# Patient Record
Sex: Male | Born: 1975 | Race: White | Hispanic: No | Marital: Married | Smoking: Former smoker
Health system: Southern US, Community
[De-identification: ages and names within clinical notes are randomized; demographics above are authoritative.]

## PROBLEM LIST (undated history)

## (undated) DIAGNOSIS — S92251A Displaced fracture of navicular [scaphoid] of right foot, initial encounter for closed fracture: Secondary | ICD-10-CM

## (undated) DIAGNOSIS — S92001A Unspecified fracture of right calcaneus, initial encounter for closed fracture: Secondary | ICD-10-CM

## (undated) DIAGNOSIS — J159 Unspecified bacterial pneumonia: Secondary | ICD-10-CM

## (undated) DIAGNOSIS — S93314A Dislocation of tarsal joint of right foot, initial encounter: Secondary | ICD-10-CM

## (undated) DIAGNOSIS — B86 Scabies: Secondary | ICD-10-CM

## (undated) DIAGNOSIS — S92102B Unspecified fracture of left talus, initial encounter for open fracture: Secondary | ICD-10-CM

## (undated) DIAGNOSIS — S82143A Displaced bicondylar fracture of unspecified tibia, initial encounter for closed fracture: Secondary | ICD-10-CM

## (undated) DIAGNOSIS — R569 Unspecified convulsions: Secondary | ICD-10-CM

---

## 2015-05-07 ENCOUNTER — Inpatient Hospital Stay (HOSPITAL_COMMUNITY)
Admission: EM | Admit: 2015-05-07 | Discharge: 2015-05-14 | DRG: 562 | Disposition: A | Discharge status: Home / Self Care | Payer: No Typology Code available for payment source | Attending: Orthopedic Surgery | Admitting: Orthopedic Surgery

## 2015-05-07 DIAGNOSIS — S92001A Unspecified fracture of right calcaneus, initial encounter for closed fracture: Secondary | ICD-10-CM | POA: Diagnosis present

## 2015-05-07 DIAGNOSIS — S92253A Displaced fracture of navicular [scaphoid] of unspecified foot, initial encounter for closed fracture: Secondary | ICD-10-CM | POA: Diagnosis present

## 2015-05-07 DIAGNOSIS — R569 Unspecified convulsions: Secondary | ICD-10-CM

## 2015-05-07 DIAGNOSIS — Z79899 Other long term (current) drug therapy: Secondary | ICD-10-CM

## 2015-05-07 DIAGNOSIS — T1490XA Injury, unspecified, initial encounter: Secondary | ICD-10-CM

## 2015-05-07 DIAGNOSIS — S92251A Displaced fracture of navicular [scaphoid] of right foot, initial encounter for closed fracture: Secondary | ICD-10-CM | POA: Diagnosis present

## 2015-05-07 DIAGNOSIS — G40909 Epilepsy, unspecified, not intractable, without status epilepticus: Secondary | ICD-10-CM | POA: Diagnosis present

## 2015-05-07 DIAGNOSIS — Z419 Encounter for procedure for purposes other than remedying health state, unspecified: Secondary | ICD-10-CM

## 2015-05-07 DIAGNOSIS — B86 Scabies: Secondary | ICD-10-CM | POA: Diagnosis present

## 2015-05-07 DIAGNOSIS — S92102B Unspecified fracture of left talus, initial encounter for open fracture: Principal | ICD-10-CM | POA: Diagnosis present

## 2015-05-07 DIAGNOSIS — Y95 Nosocomial condition: Secondary | ICD-10-CM | POA: Diagnosis not present

## 2015-05-07 DIAGNOSIS — T1490 Injury, unspecified: Secondary | ICD-10-CM | POA: Diagnosis not present

## 2015-05-07 DIAGNOSIS — S82899A Other fracture of unspecified lower leg, initial encounter for closed fracture: Secondary | ICD-10-CM

## 2015-05-07 DIAGNOSIS — S82142A Displaced bicondylar fracture of left tibia, initial encounter for closed fracture: Secondary | ICD-10-CM | POA: Diagnosis present

## 2015-05-07 DIAGNOSIS — R509 Fever, unspecified: Secondary | ICD-10-CM

## 2015-05-07 DIAGNOSIS — E559 Vitamin D deficiency, unspecified: Secondary | ICD-10-CM | POA: Diagnosis present

## 2015-05-07 DIAGNOSIS — S92109A Unspecified fracture of unspecified talus, initial encounter for closed fracture: Secondary | ICD-10-CM

## 2015-05-07 DIAGNOSIS — S93316A Dislocation of tarsal joint of unspecified foot, initial encounter: Secondary | ICD-10-CM

## 2015-05-07 DIAGNOSIS — S82143A Displaced bicondylar fracture of unspecified tibia, initial encounter for closed fracture: Secondary | ICD-10-CM

## 2015-05-07 DIAGNOSIS — J159 Unspecified bacterial pneumonia: Secondary | ICD-10-CM | POA: Diagnosis not present

## 2015-05-07 DIAGNOSIS — R40241 Glasgow coma scale score 13-15, unspecified time: Secondary | ICD-10-CM | POA: Diagnosis present

## 2015-05-07 DIAGNOSIS — F1210 Cannabis abuse, uncomplicated: Secondary | ICD-10-CM | POA: Diagnosis present

## 2015-05-07 DIAGNOSIS — S9304XA Dislocation of right ankle joint, initial encounter: Secondary | ICD-10-CM | POA: Diagnosis present

## 2015-05-07 DIAGNOSIS — Y92411 Interstate highway as the place of occurrence of the external cause: Secondary | ICD-10-CM

## 2015-05-07 DIAGNOSIS — F17210 Nicotine dependence, cigarettes, uncomplicated: Secondary | ICD-10-CM | POA: Diagnosis present

## 2015-05-07 DIAGNOSIS — S93304A Unspecified dislocation of right foot, initial encounter: Secondary | ICD-10-CM

## 2015-05-07 DIAGNOSIS — S93314A Dislocation of tarsal joint of right foot, initial encounter: Secondary | ICD-10-CM | POA: Diagnosis present

## 2015-05-07 HISTORY — DX: Dislocation of tarsal joint of right foot, initial encounter: S93.314A

## 2015-05-07 HISTORY — DX: Scabies: B86

## 2015-05-07 HISTORY — DX: Displaced fracture of navicular (scaphoid) of right foot, initial encounter for closed fracture: S92.251A

## 2015-05-07 HISTORY — DX: Unspecified bacterial pneumonia: J15.9

## 2015-05-07 HISTORY — DX: Unspecified fracture of left talus, initial encounter for open fracture: S92.102B

## 2015-05-07 HISTORY — DX: Unspecified convulsions: R56.9

## 2015-05-07 HISTORY — DX: Displaced bicondylar fracture of unspecified tibia, initial encounter for closed fracture: S82.143A

## 2015-05-07 HISTORY — PX: CLOSED REDUCTION ANKLE FRACTURE: SUR210

## 2015-05-07 HISTORY — DX: Unspecified fracture of right calcaneus, initial encounter for closed fracture: S92.001A

## 2015-05-07 MED ORDER — MORPHINE SULFATE (PF) 4 MG/ML IV SOLN
4.0000 mg | Freq: Once | INTRAVENOUS | Status: AC
  Administered 2015-05-08: 4 mg via INTRAVENOUS
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
## 2015-05-08 ENCOUNTER — Emergency Department (HOSPITAL_COMMUNITY): Payer: No Typology Code available for payment source

## 2015-05-08 ENCOUNTER — Encounter (HOSPITAL_COMMUNITY)
Admission: EM | Disposition: A | Discharge status: Home / Self Care | Payer: Self-pay | Source: Home / Self Care | Attending: Orthopedic Surgery

## 2015-05-08 ENCOUNTER — Inpatient Hospital Stay (HOSPITAL_COMMUNITY): Payer: No Typology Code available for payment source

## 2015-05-08 ENCOUNTER — Emergency Department (HOSPITAL_COMMUNITY): Payer: No Typology Code available for payment source | Admitting: Anesthesiology

## 2015-05-08 ENCOUNTER — Encounter (HOSPITAL_COMMUNITY): Payer: Self-pay

## 2015-05-08 DIAGNOSIS — F17210 Nicotine dependence, cigarettes, uncomplicated: Secondary | ICD-10-CM | POA: Diagnosis present

## 2015-05-08 DIAGNOSIS — Y92411 Interstate highway as the place of occurrence of the external cause: Secondary | ICD-10-CM | POA: Diagnosis not present

## 2015-05-08 DIAGNOSIS — S92253A Displaced fracture of navicular [scaphoid] of unspecified foot, initial encounter for closed fracture: Secondary | ICD-10-CM | POA: Diagnosis present

## 2015-05-08 DIAGNOSIS — G40909 Epilepsy, unspecified, not intractable, without status epilepticus: Secondary | ICD-10-CM | POA: Diagnosis present

## 2015-05-08 DIAGNOSIS — Z79899 Other long term (current) drug therapy: Secondary | ICD-10-CM | POA: Diagnosis not present

## 2015-05-08 DIAGNOSIS — S9304XA Dislocation of right ankle joint, initial encounter: Secondary | ICD-10-CM | POA: Diagnosis present

## 2015-05-08 DIAGNOSIS — S82142A Displaced bicondylar fracture of left tibia, initial encounter for closed fracture: Secondary | ICD-10-CM | POA: Diagnosis present

## 2015-05-08 DIAGNOSIS — Y95 Nosocomial condition: Secondary | ICD-10-CM | POA: Diagnosis not present

## 2015-05-08 DIAGNOSIS — S92102B Unspecified fracture of left talus, initial encounter for open fracture: Secondary | ICD-10-CM | POA: Diagnosis present

## 2015-05-08 DIAGNOSIS — S92001A Unspecified fracture of right calcaneus, initial encounter for closed fracture: Secondary | ICD-10-CM | POA: Diagnosis present

## 2015-05-08 DIAGNOSIS — S82143A Displaced bicondylar fracture of unspecified tibia, initial encounter for closed fracture: Secondary | ICD-10-CM

## 2015-05-08 DIAGNOSIS — B86 Scabies: Secondary | ICD-10-CM | POA: Diagnosis present

## 2015-05-08 DIAGNOSIS — J159 Unspecified bacterial pneumonia: Secondary | ICD-10-CM | POA: Diagnosis not present

## 2015-05-08 DIAGNOSIS — T1490 Injury, unspecified: Secondary | ICD-10-CM | POA: Diagnosis present

## 2015-05-08 DIAGNOSIS — F1210 Cannabis abuse, uncomplicated: Secondary | ICD-10-CM | POA: Diagnosis present

## 2015-05-08 DIAGNOSIS — E559 Vitamin D deficiency, unspecified: Secondary | ICD-10-CM | POA: Diagnosis present

## 2015-05-08 DIAGNOSIS — R40241 Glasgow coma scale score 13-15, unspecified time: Secondary | ICD-10-CM | POA: Diagnosis present

## 2015-05-08 HISTORY — DX: Displaced bicondylar fracture of unspecified tibia, initial encounter for closed fracture: S82.143A

## 2015-05-08 HISTORY — PX: I & D EXTREMITY: SHX5045

## 2015-05-08 HISTORY — PX: ANKLE CLOSED REDUCTION: SHX880

## 2015-05-08 HISTORY — PX: CAST APPLICATION: SHX380

## 2015-05-08 LAB — BASIC METABOLIC PANEL
ANION GAP: 11 (ref 5–15)
BUN: 9 mg/dL (ref 6–20)
CO2: 22 mmol/L (ref 22–32)
Calcium: 8.6 mg/dL — ABNORMAL LOW (ref 8.9–10.3)
Chloride: 108 mmol/L (ref 101–111)
Creatinine, Ser: 0.82 mg/dL (ref 0.61–1.24)
GFR calc non Af Amer: >60 mL/min (ref >60)
Glucose, Bld: 87 mg/dL (ref 65–99)
Potassium: 3.7 mmol/L (ref 3.5–5.1)
Sodium: 141 mmol/L (ref 135–145)

## 2015-05-08 LAB — COMPREHENSIVE METABOLIC PANEL
ALBUMIN: 4.2 g/dL (ref 3.5–5.0)
ALK PHOS: 72 U/L (ref 38–126)
ALT: 30 U/L (ref 17–63)
ANION GAP: 12 (ref 5–15)
AST: 41 U/L (ref 15–41)
BUN: 8 mg/dL (ref 6–20)
CALCIUM: 9.4 mg/dL (ref 8.9–10.3)
CO2: 20 mmol/L — AB (ref 22–32)
Chloride: 108 mmol/L (ref 101–111)
Creatinine, Ser: 1.04 mg/dL (ref 0.61–1.24)
GFR calc Af Amer: >60 mL/min (ref >60)
GFR calc non Af Amer: >60 mL/min (ref >60)
GLUCOSE: 152 mg/dL — AB (ref 65–99)
POTASSIUM: 3.5 mmol/L (ref 3.5–5.1)
SODIUM: 140 mmol/L (ref 135–145)
Total Bilirubin: 0.6 mg/dL (ref 0.3–1.2)
Total Protein: 7.3 g/dL (ref 6.5–8.1)

## 2015-05-08 LAB — SAMPLE TO BLOOD BANK

## 2015-05-08 LAB — CDS SEROLOGY

## 2015-05-08 LAB — CBC
HEMATOCRIT: 38.3 % — AB (ref 39.0–52.0)
HEMATOCRIT: 46.1 % (ref 39.0–52.0)
HEMOGLOBIN: 15.4 g/dL (ref 13.0–17.0)
Hemoglobin: 12.7 g/dL — ABNORMAL LOW (ref 13.0–17.0)
MCH: 30.9 pg (ref 26.0–34.0)
MCH: 31.2 pg (ref 26.0–34.0)
MCHC: 33.2 g/dL (ref 30.0–36.0)
MCHC: 33.4 g/dL (ref 30.0–36.0)
MCV: 93.2 fL (ref 78.0–100.0)
MCV: 93.5 fL (ref 78.0–100.0)
PLATELETS: 252 10*3/uL (ref 150–400)
Platelets: 290 10*3/uL (ref 150–400)
RBC: 4.11 MIL/uL — ABNORMAL LOW (ref 4.22–5.81)
RBC: 4.93 MIL/uL (ref 4.22–5.81)
RDW: 13.8 % (ref 11.5–15.5)
RDW: 13.9 % (ref 11.5–15.5)
WBC: 20 10*3/uL — AB (ref 4.0–10.5)
WBC: 24.5 10*3/uL — ABNORMAL HIGH (ref 4.0–10.5)

## 2015-05-08 LAB — PROTIME-INR
INR: 1.02 (ref 0.00–1.49)
PROTHROMBIN TIME: 13.6 s (ref 11.6–15.2)

## 2015-05-08 LAB — ETHANOL

## 2015-05-08 SURGERY — CLOSED REDUCTION, ANKLE
Anesthesia: General | Site: Ankle | Laterality: Right

## 2015-05-08 MED ORDER — MORPHINE SULFATE (PF) 4 MG/ML IV SOLN
INTRAVENOUS | Status: AC
  Filled 2015-05-08: qty 1

## 2015-05-08 MED ORDER — CEFAZOLIN SODIUM 1-5 GM-% IV SOLN
1.0000 g | Freq: Four times a day (QID) | INTRAVENOUS | Status: DC
  Administered 2015-05-08: 1 g via INTRAVENOUS
  Filled 2015-05-08 (×3): qty 50

## 2015-05-08 MED ORDER — CEFAZOLIN SODIUM 1-5 GM-% IV SOLN
1.0000 g | Freq: Three times a day (TID) | INTRAVENOUS | Status: AC
  Administered 2015-05-08 – 2015-05-10 (×6): 1 g via INTRAVENOUS
  Filled 2015-05-08 (×6): qty 50

## 2015-05-08 MED ORDER — ALPRAZOLAM 0.5 MG PO TABS
0.5000 mg | ORAL_TABLET | Freq: Three times a day (TID) | ORAL | Status: DC
  Administered 2015-05-08 – 2015-05-10 (×7): 0.5 mg via ORAL
  Filled 2015-05-08 (×7): qty 1

## 2015-05-08 MED ORDER — MORPHINE SULFATE (PF) 2 MG/ML IV SOLN
INTRAVENOUS | Status: AC
  Administered 2015-05-08: 4 mg via INTRAVENOUS
  Filled 2015-05-08: qty 2

## 2015-05-08 MED ORDER — METHOCARBAMOL 500 MG PO TABS
500.0000 mg | ORAL_TABLET | Freq: Four times a day (QID) | ORAL | Status: DC | PRN
  Administered 2015-05-08: 500 mg via ORAL
  Filled 2015-05-08: qty 1

## 2015-05-08 MED ORDER — LIDOCAINE HCL (CARDIAC) 20 MG/ML IV SOLN
INTRAVENOUS | Status: DC | PRN
  Administered 2015-05-08: 100 mg via INTRAVENOUS

## 2015-05-08 MED ORDER — LIDOCAINE HCL (CARDIAC) 20 MG/ML IV SOLN
INTRAVENOUS | Status: AC
  Filled 2015-05-08: qty 5

## 2015-05-08 MED ORDER — OXYCODONE-ACETAMINOPHEN 5-325 MG PO TABS
1.0000 | ORAL_TABLET | Freq: Four times a day (QID) | ORAL | Status: DC | PRN
  Administered 2015-05-08 – 2015-05-09 (×4): 2 via ORAL
  Filled 2015-05-08 (×4): qty 2

## 2015-05-08 MED ORDER — SUCCINYLCHOLINE CHLORIDE 20 MG/ML IJ SOLN
INTRAMUSCULAR | Status: DC | PRN
  Administered 2015-05-08: 80 mg via INTRAVENOUS

## 2015-05-08 MED ORDER — MORPHINE SULFATE (PF) 2 MG/ML IV SOLN
INTRAVENOUS | Status: AC
  Filled 2015-05-08: qty 1

## 2015-05-08 MED ORDER — ACETAMINOPHEN 650 MG RE SUPP: 650.0000 mg | Freq: Four times a day (QID) | RECTAL | Status: DC | PRN

## 2015-05-08 MED ORDER — METHOCARBAMOL 1000 MG/10ML IJ SOLN: 500.0000 mg | Freq: Four times a day (QID) | INTRAVENOUS | Status: DC | PRN

## 2015-05-08 MED ORDER — MORPHINE SULFATE (PF) 2 MG/ML IV SOLN
2.0000 mg | INTRAVENOUS | Status: DC | PRN
  Administered 2015-05-08 – 2015-05-10 (×15): 2 mg via INTRAVENOUS
  Filled 2015-05-08 (×15): qty 1

## 2015-05-08 MED ORDER — MEPERIDINE HCL 25 MG/ML IJ SOLN
INTRAMUSCULAR | Status: AC
  Filled 2015-05-08: qty 1

## 2015-05-08 MED ORDER — OXYCODONE HCL 5 MG PO TABS
5.0000 mg | ORAL_TABLET | ORAL | Status: DC | PRN
  Administered 2015-05-08 – 2015-05-10 (×11): 10 mg via ORAL
  Filled 2015-05-08 (×11): qty 2

## 2015-05-08 MED ORDER — IOHEXOL 300 MG/ML  SOLN
100.0000 mL | Freq: Once | INTRAMUSCULAR | Status: AC | PRN
  Administered 2015-05-08: 100 mL via INTRAVENOUS

## 2015-05-08 MED ORDER — ACETAMINOPHEN 325 MG PO TABS: 650.0000 mg | ORAL_TABLET | Freq: Four times a day (QID) | ORAL | Status: DC | PRN

## 2015-05-08 MED ORDER — MORPHINE SULFATE (PF) 2 MG/ML IV SOLN
2.0000 mg | INTRAVENOUS | Status: DC | PRN
  Administered 2015-05-08 (×2): 2 mg via INTRAVENOUS

## 2015-05-08 MED ORDER — PROMETHAZINE HCL 25 MG/ML IJ SOLN: 6.2500 mg | INTRAMUSCULAR | Status: DC | PRN

## 2015-05-08 MED ORDER — CEFAZOLIN SODIUM-DEXTROSE 2-3 GM-% IV SOLR
2.0000 g | Freq: Once | INTRAVENOUS | Status: AC
  Administered 2015-05-08: 2 g via INTRAVENOUS

## 2015-05-08 MED ORDER — ONDANSETRON HCL 4 MG/2ML IJ SOLN
INTRAMUSCULAR | Status: AC
  Filled 2015-05-08: qty 2

## 2015-05-08 MED ORDER — METOCLOPRAMIDE HCL 5 MG PO TABS: 5.0000 mg | ORAL_TABLET | Freq: Three times a day (TID) | ORAL | Status: DC | PRN

## 2015-05-08 MED ORDER — KETOROLAC TROMETHAMINE 15 MG/ML IJ SOLN
15.0000 mg | Freq: Four times a day (QID) | INTRAMUSCULAR | Status: AC
  Administered 2015-05-08 – 2015-05-09 (×4): 15 mg via INTRAVENOUS
  Filled 2015-05-08 (×4): qty 1

## 2015-05-08 MED ORDER — OXYCODONE HCL 5 MG PO TABS
5.0000 mg | ORAL_TABLET | Freq: Four times a day (QID) | ORAL | Status: DC | PRN
  Administered 2015-05-08: 10 mg via ORAL
  Filled 2015-05-08: qty 2

## 2015-05-08 MED ORDER — INFLUENZA VAC SPLIT QUAD 0.5 ML IM SUSY
0.5000 mL | PREFILLED_SYRINGE | INTRAMUSCULAR | Status: AC
  Administered 2015-05-09: 0.5 mL via INTRAMUSCULAR
  Filled 2015-05-08: qty 0.5

## 2015-05-08 MED ORDER — ONDANSETRON HCL 4 MG/2ML IJ SOLN: 4.0000 mg | Freq: Four times a day (QID) | INTRAMUSCULAR | Status: DC | PRN

## 2015-05-08 MED ORDER — HYDROXYZINE HCL 25 MG PO TABS
25.0000 mg | ORAL_TABLET | Freq: Four times a day (QID) | ORAL | Status: DC | PRN
  Administered 2015-05-08 – 2015-05-10 (×7): 25 mg via ORAL
  Filled 2015-05-08 (×7): qty 1

## 2015-05-08 MED ORDER — METOCLOPRAMIDE HCL 5 MG/ML IJ SOLN: 5.0000 mg | Freq: Three times a day (TID) | INTRAMUSCULAR | Status: DC | PRN

## 2015-05-08 MED ORDER — MIDAZOLAM HCL 2 MG/2ML IJ SOLN
INTRAMUSCULAR | Status: AC
  Filled 2015-05-08: qty 2

## 2015-05-08 MED ORDER — SODIUM CHLORIDE 0.9 % IR SOLN
Status: DC | PRN
  Administered 2015-05-08: 3000 mL

## 2015-05-08 MED ORDER — PROPOFOL 10 MG/ML IV BOLUS
INTRAVENOUS | Status: DC | PRN
  Administered 2015-05-08: 200 mg via INTRAVENOUS

## 2015-05-08 MED ORDER — MORPHINE SULFATE (PF) 4 MG/ML IV SOLN
4.0000 mg | Freq: Once | INTRAVENOUS | Status: AC
  Administered 2015-05-08: 4 mg via INTRAVENOUS
  Filled 2015-05-08: qty 1

## 2015-05-08 MED ORDER — LACTATED RINGERS IV SOLN
INTRAVENOUS | Status: DC
  Administered 2015-05-08 – 2015-05-10 (×2): via INTRAVENOUS

## 2015-05-08 MED ORDER — FENTANYL CITRATE (PF) 250 MCG/5ML IJ SOLN
INTRAMUSCULAR | Status: AC
  Filled 2015-05-08: qty 5

## 2015-05-08 MED ORDER — PROPOFOL 10 MG/ML IV BOLUS
INTRAVENOUS | Status: AC
  Filled 2015-05-08: qty 20

## 2015-05-08 MED ORDER — SODIUM CHLORIDE 0.9 % IV SOLN
INTRAVENOUS | Status: DC | PRN
  Administered 2015-05-08: 02:00:00 via INTRAVENOUS

## 2015-05-08 MED ORDER — IVERMECTIN 3 MG PO TABS
200.0000 ug/kg | ORAL_TABLET | Freq: Once | ORAL | Status: AC
  Administered 2015-05-08: 13500 ug via ORAL
  Filled 2015-05-08: qty 5

## 2015-05-08 MED ORDER — ONDANSETRON HCL 4 MG/2ML IJ SOLN
INTRAMUSCULAR | Status: DC | PRN
  Administered 2015-05-08: 4 mg via INTRAVENOUS

## 2015-05-08 MED ORDER — ONDANSETRON HCL 4 MG PO TABS: 4.0000 mg | ORAL_TABLET | Freq: Four times a day (QID) | ORAL | Status: DC | PRN

## 2015-05-08 MED ORDER — METHOCARBAMOL 1000 MG/10ML IJ SOLN
500.0000 mg | Freq: Four times a day (QID) | INTRAMUSCULAR | Status: DC | PRN
  Filled 2015-05-08: qty 5

## 2015-05-08 MED ORDER — MEPERIDINE HCL 25 MG/ML IJ SOLN
6.2500 mg | INTRAMUSCULAR | Status: DC | PRN
  Administered 2015-05-08: 6.25 mg via INTRAVENOUS

## 2015-05-08 MED ORDER — TETANUS-DIPHTH-ACELL PERTUSSIS 5-2.5-18.5 LF-MCG/0.5 IM SUSP
0.5000 mL | Freq: Once | INTRAMUSCULAR | Status: AC
  Administered 2015-05-08: 0.5 mL via INTRAMUSCULAR
  Filled 2015-05-08: qty 0.5

## 2015-05-08 MED ORDER — DIVALPROEX SODIUM 500 MG PO DR TAB
500.0000 mg | DELAYED_RELEASE_TABLET | Freq: Two times a day (BID) | ORAL | Status: DC
  Administered 2015-05-08 – 2015-05-10 (×5): 500 mg via ORAL
  Filled 2015-05-08 (×5): qty 1

## 2015-05-08 MED ORDER — KETOROLAC TROMETHAMINE 15 MG/ML IJ SOLN
INTRAMUSCULAR | Status: AC
  Filled 2015-05-08: qty 1

## 2015-05-08 MED ORDER — FENTANYL CITRATE (PF) 100 MCG/2ML IJ SOLN
INTRAMUSCULAR | Status: DC | PRN
  Administered 2015-05-08 (×2): 50 ug via INTRAVENOUS

## 2015-05-08 MED ORDER — METHOCARBAMOL 500 MG PO TABS
1000.0000 mg | ORAL_TABLET | Freq: Four times a day (QID) | ORAL | Status: DC | PRN
  Administered 2015-05-08 – 2015-05-14 (×19): 1000 mg via ORAL
  Filled 2015-05-08 (×21): qty 2

## 2015-05-08 MED ORDER — PNEUMOCOCCAL VAC POLYVALENT 25 MCG/0.5ML IJ INJ
0.5000 mL | INJECTION | INTRAMUSCULAR | Status: AC
  Administered 2015-05-09: 0.5 mL via INTRAMUSCULAR
  Filled 2015-05-08: qty 0.5

## 2015-05-08 MED ORDER — ENOXAPARIN SODIUM 40 MG/0.4ML ~~LOC~~ SOLN
40.0000 mg | SUBCUTANEOUS | Status: DC
  Administered 2015-05-09 – 2015-05-10 (×2): 40 mg via SUBCUTANEOUS
  Filled 2015-05-08 (×2): qty 0.4

## 2015-05-08 SURGICAL SUPPLY — 64 items
BAG DECANTER FOR FLEXI CONT (MISCELLANEOUS) ×4 IMPLANT
BANDAGE ELASTIC 4 VELCRO ST LF (GAUZE/BANDAGES/DRESSINGS) ×4 IMPLANT
BNDG COHESIVE 4X5 TAN STRL (GAUZE/BANDAGES/DRESSINGS) ×4 IMPLANT
BNDG ELASTIC 6X10 VLCR STRL LF (GAUZE/BANDAGES/DRESSINGS) ×8 IMPLANT
BNDG ELASTIC 6X15 VLCR STRL LF (GAUZE/BANDAGES/DRESSINGS) ×4 IMPLANT
BNDG GAUZE ELAST 4 BULKY (GAUZE/BANDAGES/DRESSINGS) ×4 IMPLANT
COVER SURGICAL LIGHT HANDLE (MISCELLANEOUS) ×4 IMPLANT
CUFF TOURNIQUET SINGLE 18IN (TOURNIQUET CUFF) ×4 IMPLANT
CUFF TOURNIQUET SINGLE 24IN (TOURNIQUET CUFF) IMPLANT
CUFF TOURNIQUET SINGLE 34IN LL (TOURNIQUET CUFF) IMPLANT
CUFF TOURNIQUET SINGLE 44IN (TOURNIQUET CUFF) IMPLANT
DRSG ADAPTIC 3X8 NADH LF (GAUZE/BANDAGES/DRESSINGS) ×4 IMPLANT
DRSG EMULSION OIL 3X3 NADH (GAUZE/BANDAGES/DRESSINGS) ×4 IMPLANT
DRSG PAD ABDOMINAL 8X10 ST (GAUZE/BANDAGES/DRESSINGS) ×8 IMPLANT
ELECT PENCIL ROCKER SW 15FT (MISCELLANEOUS) ×4 IMPLANT
ELECT REM PT RETURN 9FT ADLT (ELECTROSURGICAL)
ELECTRODE REM PT RTRN 9FT ADLT (ELECTROSURGICAL) IMPLANT
GAUZE PACKING IODOFORM 1/4X15 (GAUZE/BANDAGES/DRESSINGS) ×4 IMPLANT
GAUZE SPONGE 4X4 12PLY STRL (GAUZE/BANDAGES/DRESSINGS) ×8 IMPLANT
GAUZE XEROFORM 5X9 LF (GAUZE/BANDAGES/DRESSINGS) ×4 IMPLANT
GLOVE BIO SURGEON STRL SZ7 (GLOVE) ×8 IMPLANT
GLOVE BIOGEL PI IND STRL 7.5 (GLOVE) ×3 IMPLANT
GLOVE BIOGEL PI IND STRL 8 (GLOVE) ×3 IMPLANT
GLOVE BIOGEL PI IND STRL 8.5 (GLOVE) ×3 IMPLANT
GLOVE BIOGEL PI INDICATOR 7.5 (GLOVE) ×1
GLOVE BIOGEL PI INDICATOR 8 (GLOVE) ×1
GLOVE BIOGEL PI INDICATOR 8.5 (GLOVE) ×1
GLOVE ORTHO TXT STRL SZ7.5 (GLOVE) ×4 IMPLANT
GLOVE SS BIOGEL STRL SZ 8.5 (GLOVE) ×3 IMPLANT
GLOVE SUPERSENSE BIOGEL SZ 8.5 (GLOVE) ×1
GOWN STRL REUS W/ TWL LRG LVL3 (GOWN DISPOSABLE) ×3 IMPLANT
GOWN STRL REUS W/TWL 2XL LVL3 (GOWN DISPOSABLE) ×8 IMPLANT
GOWN STRL REUS W/TWL LRG LVL3 (GOWN DISPOSABLE) ×1
HANDPIECE INTERPULSE COAX TIP (DISPOSABLE)
IMMOBILIZER KNEE 22 (SOFTGOODS) ×4 IMPLANT
KIT BASIN OR (CUSTOM PROCEDURE TRAY) ×4 IMPLANT
KIT ROOM TURNOVER OR (KITS) ×4 IMPLANT
MANIFOLD NEPTUNE II (INSTRUMENTS) ×4 IMPLANT
NEEDLE HYPO 25GX1X1/2 BEV (NEEDLE) ×4 IMPLANT
NS IRRIG 1000ML POUR BTL (IV SOLUTION) ×12 IMPLANT
PACK ORTHO EXTREMITY (CUSTOM PROCEDURE TRAY) ×4 IMPLANT
PAD ARMBOARD 7.5X6 YLW CONV (MISCELLANEOUS) ×8 IMPLANT
PADDING CAST ABS 6INX4YD NS (CAST SUPPLIES) ×2
PADDING CAST ABS COTTON 6X4 NS (CAST SUPPLIES) ×6 IMPLANT
SET HNDPC FAN SPRY TIP SCT (DISPOSABLE) IMPLANT
SPLINT FIBERGLASS 4X30 (CAST SUPPLIES) ×4 IMPLANT
SPONGE GAUZE 4X4 12PLY STER LF (GAUZE/BANDAGES/DRESSINGS) ×4 IMPLANT
SPONGE LAP 18X18 X RAY DECT (DISPOSABLE) ×4 IMPLANT
SPONGE LAP 4X18 X RAY DECT (DISPOSABLE) ×4 IMPLANT
STOCKINETTE IMPERVIOUS 9X36 MD (GAUZE/BANDAGES/DRESSINGS) ×4 IMPLANT
SURGIFLO W/THROMBIN 8M KIT (HEMOSTASIS) IMPLANT
SUT BONE WAX W31G (SUTURE) ×4 IMPLANT
SUT ETHILON 4 0 PS 2 18 (SUTURE) IMPLANT
SUT PDS 0 CT 1 18  CR/8 (SUTURE) IMPLANT
SUT PROLENE 2 0 FS (SUTURE) ×4 IMPLANT
SUT VIC AB 2-0 CT1 18 (SUTURE) IMPLANT
SYR CONTROL 10ML LL (SYRINGE) IMPLANT
TOWEL OR 17X24 6PK STRL BLUE (TOWEL DISPOSABLE) ×4 IMPLANT
TOWEL OR 17X26 10 PK STRL BLUE (TOWEL DISPOSABLE) ×4 IMPLANT
TUBE ANAEROBIC SPECIMEN COL (MISCELLANEOUS) IMPLANT
TUBE CONNECTING 12X1/4 (SUCTIONS) ×4 IMPLANT
UNDERPAD 30X30 INCONTINENT (UNDERPADS AND DIAPERS) ×4 IMPLANT
WATER STERILE IRR 1000ML POUR (IV SOLUTION) ×4 IMPLANT
YANKAUER SUCT BULB TIP NO VENT (SUCTIONS) ×4 IMPLANT
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
## 2015-05-08 NOTE — Anesthesia Preprocedure Evaluation (Addendum)
Anesthesia Evaluation  Patient identified by MRN, date of birth, ID band Patient awake    Reviewed: Allergy & Precautions, NPO status , Patient's Chart, lab work & pertinent test results  Airway Mallampati: I       Dental  (+) Teeth Intact, Dental Advisory Given, Poor Dentition   Pulmonary Current Smoker,    Pulmonary exam normal breath sounds clear to auscultation       Cardiovascular  Rhythm:Regular Rate:Normal     Neuro/Psych Seizures -, Well Controlled,     GI/Hepatic   Endo/Other    Renal/GU      Musculoskeletal   Abdominal   Peds  Hematology   Anesthesia Other Findings   Reproductive/Obstetrics                            Anesthesia Physical Anesthesia Plan  ASA: II and emergent  Anesthesia Plan: General   Post-op Pain Management:    Induction: Intravenous, Rapid sequence and Cricoid pressure planned  Airway Management Planned:   Additional Equipment:   Intra-op Plan:   Post-operative Plan: Extubation in OR  Informed Consent:   Plan Discussed with: CRNA, Anesthesiologist and Surgeon  Anesthesia Plan Comments:         Anesthesia Quick Evaluation
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
## 2015-05-08 NOTE — Progress Notes (Signed)
Pt reported having scabies recently, infection control Margaret notified and she said to have a physician confirm and then place pt on contact precaution. PA Casimiro NeedleMichael confirms pt scabies and pt placed on contact precautions. Margaret from infection control notified. Pt notified and educated. Arabella MerlesP. Amo Jakin Pavao RN.

## 2015-05-08 NOTE — Anesthesia Postprocedure Evaluation (Signed)
Anesthesia Post Note  Patient: William Atkins  Procedure(s) Performed: Procedure(s) (LRB): CLOSED REDUCTION ANKLE (Right) IRRIGATION AND DEBRIDEMENT EXTREMITY (Left) CAST APPLICATION (Bilateral)  Patient location during evaluation: PACU Anesthesia Type: General Level of consciousness: awake Pain management: pain level controlled Vital Signs Assessment: post-procedure vital signs reviewed and stable Respiratory status: spontaneous breathing, nonlabored ventilation, respiratory function stable and patient connected to nasal cannula oxygen Cardiovascular status: blood pressure returned to baseline and stable Postop Assessment: no signs of nausea or vomiting Anesthetic complications: no    Last Vitals:  Filed Vitals:   05/08/15 0130 05/08/15 0145  BP: 127/79 127/80  Pulse: 95 91  Temp:    Resp: 19 13    Last Pain:  Filed Vitals:   05/08/15 0153  PainSc: 7                  Reino KentJudd, Zerina Hallinan J
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
## 2015-05-08 NOTE — ED Notes (Signed)
Pt transported to OR holding by this RN.

## 2015-05-08 NOTE — Brief Op Note (Signed)
05/07/2015 - 05/08/2015  3:14 AM  PATIENT:  William Atkins  39 y.o. male  PRE-OPERATIVE DIAGNOSIS:  Bilateral Ankle Fractures  POST-OPERATIVE DIAGNOSIS:  Bilateral Ankle Fractures With Right Closed Talus Fracture  PROCEDURE:  Procedure(s): CLOSED REDUCTION ANKLE (Right) IRRIGATION AND DEBRIDEMENT EXTREMITY (Left) CAST APPLICATION (Bilateral)  SURGEON:  Surgeon(s) and Role:    * Venita Lickahari Maddyson Keil, MD - Primary  PHYSICIAN ASSISTANT:   ASSISTANTS: none   ANESTHESIA:   general  EBL:  Total I/O In: 300 [I.V.:300] Out: 0   BLOOD ADMINISTERED:none  DRAINS: none   LOCAL MEDICATIONS USED:  NONE  SPECIMEN:  No Specimen  DISPOSITION OF SPECIMEN:  N/A  COUNTS:  YES  TOURNIQUET:  * No tourniquets in log *  DICTATION: .Other Dictation: Dictation Number [id number]  PLAN OF CARE: Admit to inpatient   PATIENT DISPOSITION:  PACU - hemodynamically stable.

## 2015-05-08 NOTE — Progress Notes (Signed)
Orthopedic Tech Progress Note Patient Details:  Alain HoneyChris Calvo 01/04/1976 [id number] Assisted with orthopedic assessment. Patient ID: Alain HoneyChris Mcguinn, male   DOB: 07/05/1975, 39 y.o.   MRN: [id number]   Lesle ChrisGilliland, Phuoc Huy L 05/08/2015, 12:22 AM
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
## 2015-05-08 NOTE — Anesthesia Procedure Notes (Signed)
Procedure Name: Intubation Date/Time: 05/08/2015 2:41 AM Performed by: Molli HazardGORDON, Aedyn Kempfer M Pre-anesthesia Checklist: Patient identified, Emergency Drugs available, Suction available and Patient being monitored Patient Re-evaluated:Patient Re-evaluated prior to inductionOxygen Delivery Method: Circle system utilized Preoxygenation: Pre-oxygenation with 100% oxygen Intubation Type: IV induction, Rapid sequence and Cricoid Pressure applied Laryngoscope Size: Miller and 2 Grade View: Grade I Tube type: Oral Tube size: 7.5 mm Number of attempts: 1 Airway Equipment and Method: Stylet Placement Confirmation: ETT inserted through vocal cords under direct vision,  positive ETCO2 and breath sounds checked- equal and bilateral Secured at: 23 cm Tube secured with: Tape Dental Injury: Teeth and Oropharynx as per pre-operative assessment

## 2015-05-08 NOTE — Transfer of Care (Signed)
Immediate Anesthesia Transfer of Care Note  Patient: William Atkins  Procedure(s) Performed: Procedure(s): CLOSED REDUCTION ANKLE (Right) IRRIGATION AND DEBRIDEMENT EXTREMITY (Left) CAST APPLICATION (Bilateral)  Patient Location: PACU  Anesthesia Type:General  Level of Consciousness: awake, sedated and responds to stimulation  Airway & Oxygen Therapy: Patient connected to nasal cannula oxygen  Post-op Assessment: Report given to RN, Post -op Vital signs reviewed and stable and Patient moving all extremities X 4  Post vital signs: Reviewed and stable  Last Vitals:  Filed Vitals:   05/08/15 0130 05/08/15 0145  BP: 127/79 127/80  Pulse: 95 91  Temp:    Resp: 19 13    Complications: No apparent anesthesia complications

## 2015-05-09 ENCOUNTER — Encounter (HOSPITAL_COMMUNITY): Payer: Self-pay | Admitting: Orthopedic Surgery

## 2015-05-09 ENCOUNTER — Inpatient Hospital Stay (HOSPITAL_COMMUNITY): Payer: No Typology Code available for payment source

## 2015-05-09 DIAGNOSIS — B86 Scabies: Secondary | ICD-10-CM

## 2015-05-09 DIAGNOSIS — S92102B Unspecified fracture of left talus, initial encounter for open fracture: Secondary | ICD-10-CM

## 2015-05-09 DIAGNOSIS — S92001A Unspecified fracture of right calcaneus, initial encounter for closed fracture: Secondary | ICD-10-CM

## 2015-05-09 DIAGNOSIS — S92251A Displaced fracture of navicular [scaphoid] of right foot, initial encounter for closed fracture: Secondary | ICD-10-CM

## 2015-05-09 DIAGNOSIS — R569 Unspecified convulsions: Secondary | ICD-10-CM

## 2015-05-09 HISTORY — DX: Displaced fracture of navicular (scaphoid) of right foot, initial encounter for closed fracture: S92.251A

## 2015-05-09 HISTORY — DX: Unspecified fracture of left talus, initial encounter for open fracture: S92.102B

## 2015-05-09 HISTORY — DX: Unspecified fracture of right calcaneus, initial encounter for closed fracture: S92.001A

## 2015-05-09 HISTORY — DX: Scabies: B86

## 2015-05-09 LAB — BASIC METABOLIC PANEL
ANION GAP: 6 (ref 5–15)
BUN: 8 mg/dL (ref 6–20)
CHLORIDE: 103 mmol/L (ref 101–111)
CO2: 29 mmol/L (ref 22–32)
Calcium: 8.7 mg/dL — ABNORMAL LOW (ref 8.9–10.3)
Creatinine, Ser: 0.88 mg/dL (ref 0.61–1.24)
GFR calc Af Amer: >60 mL/min (ref >60)
GLUCOSE: 82 mg/dL (ref 65–99)
POTASSIUM: 3.7 mmol/L (ref 3.5–5.1)
Sodium: 138 mmol/L (ref 135–145)

## 2015-05-09 LAB — URINALYSIS, ROUTINE W REFLEX MICROSCOPIC
Bilirubin Urine: NEGATIVE
GLUCOSE, UA: NEGATIVE mg/dL
Hgb urine dipstick: NEGATIVE
Ketones, ur: 15 mg/dL — AB
LEUKOCYTES UA: NEGATIVE
Nitrite: NEGATIVE
PH: 6 (ref 5.0–8.0)
PROTEIN: NEGATIVE mg/dL
SPECIFIC GRAVITY, URINE: 1.025 (ref 1.005–1.030)

## 2015-05-09 LAB — CBC
HEMATOCRIT: 39.2 % (ref 39.0–52.0)
HEMOGLOBIN: 12.4 g/dL — AB (ref 13.0–17.0)
MCH: 30 pg (ref 26.0–34.0)
MCHC: 31.6 g/dL (ref 30.0–36.0)
MCV: 94.7 fL (ref 78.0–100.0)
Platelets: 233 10*3/uL (ref 150–400)
RBC: 4.14 MIL/uL — AB (ref 4.22–5.81)
RDW: 14 % (ref 11.5–15.5)
WBC: 13.2 10*3/uL — AB (ref 4.0–10.5)

## 2015-05-09 LAB — RAPID URINE DRUG SCREEN, HOSP PERFORMED
AMPHETAMINES: NOT DETECTED
BENZODIAZEPINES: POSITIVE — AB
Barbiturates: NOT DETECTED
COCAINE: NOT DETECTED
Opiates: POSITIVE — AB
TETRAHYDROCANNABINOL: POSITIVE — AB

## 2015-05-09 MED ORDER — CEFAZOLIN SODIUM 1-5 GM-% IV SOLN
1.0000 g | INTRAVENOUS | Status: DC
Start: 2015-05-10 — End: 2015-05-10
  Filled 2015-05-09: qty 50

## 2015-05-09 NOTE — Progress Notes (Signed)
Patient called for pain medication at 8:45am. 10mg  oxycodone administered. This RN wrote the time the next dose would be due on the white board. The medicine is prescribed every 3 hours as needed for pain. The patient stated "they were giving this to me every 2 hours". His girlfriend states "no, they were giving you the morphine" Patient states "They quit giving me the morphine and they were giving me the oxycodone every 2 hours. Is this order different per nurse" I explained to the patient that the order states every 3 hours and read of his last doses, "You received oxycodone at 11pm, 5:45am and now 8:45am". Patient becomes angry at this point and states "I want to talk to a nurse supervisor. Y'all wanna play games, then its go time. I am this close to being transferred out of this hospital. I want to speak to the nurse supervisor now and if she doesn't come, I will be calling the board of directors. And I no longer want you to be my nurse." I said, "Ok sir, I will go and get the charge nurse and let her know about your concerns". I explained to the charge nurse on duty and another nurse and I will switch patients. The charge nurse is in the room speaking with the patient now.

## 2015-05-09 NOTE — Progress Notes (Signed)
Orthopedic Tech Progress Note Patient Details:  William Atkins 11/14/1975 [id number]  Ortho Devices Type of Ortho Device: William Atkins splint Ortho Device/Splint Location: lle Ortho Device/Splint Interventions: Application   [name] 05/09/2015, 11:44 AM
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
## 2015-05-10 ENCOUNTER — Inpatient Hospital Stay (HOSPITAL_COMMUNITY): Payer: No Typology Code available for payment source | Admitting: Anesthesiology

## 2015-05-10 ENCOUNTER — Inpatient Hospital Stay (HOSPITAL_COMMUNITY): Payer: No Typology Code available for payment source

## 2015-05-10 ENCOUNTER — Encounter (HOSPITAL_COMMUNITY)
Admission: EM | Disposition: A | Discharge status: Home / Self Care | Payer: Self-pay | Source: Home / Self Care | Attending: Orthopedic Surgery

## 2015-05-10 HISTORY — PX: I & D EXTREMITY: SHX5045

## 2015-05-10 HISTORY — PX: ORIF TIBIA PLATEAU: SHX2132

## 2015-05-10 HISTORY — PX: OPEN REDUCTION, INTERNAL FIXATION (ORIF) CALCANEAL FRACTURE WITH FUSION: SHX5994

## 2015-05-10 LAB — CBC
HEMATOCRIT: 36 % — AB (ref 39.0–52.0)
Hemoglobin: 11.7 g/dL — ABNORMAL LOW (ref 13.0–17.0)
MCH: 30.7 pg (ref 26.0–34.0)
MCHC: 32.5 g/dL (ref 30.0–36.0)
MCV: 94.5 fL (ref 78.0–100.0)
Platelets: 209 10*3/uL (ref 150–400)
RBC: 3.81 MIL/uL — ABNORMAL LOW (ref 4.22–5.81)
RDW: 13.5 % (ref 11.5–15.5)
WBC: 14.5 10*3/uL — AB (ref 4.0–10.5)

## 2015-05-10 LAB — BASIC METABOLIC PANEL
ANION GAP: 8 (ref 5–15)
BUN: 6 mg/dL (ref 6–20)
CALCIUM: 9 mg/dL (ref 8.9–10.3)
CO2: 31 mmol/L (ref 22–32)
Chloride: 97 mmol/L — ABNORMAL LOW (ref 101–111)
Creatinine, Ser: 0.86 mg/dL (ref 0.61–1.24)
GFR calc Af Amer: >60 mL/min (ref >60)
GFR calc non Af Amer: >60 mL/min (ref >60)
GLUCOSE: 90 mg/dL (ref 65–99)
POTASSIUM: 3.7 mmol/L (ref 3.5–5.1)
Sodium: 136 mmol/L (ref 135–145)

## 2015-05-10 LAB — PROTIME-INR
INR: 0.97 (ref 0.00–1.49)
Prothrombin Time: 13.1 seconds (ref 11.6–15.2)

## 2015-05-10 LAB — TYPE AND SCREEN
ABO/RH(D): O POS
Antibody Screen: NEGATIVE

## 2015-05-10 LAB — APTT: APTT: 34 s (ref 24–37)

## 2015-05-10 LAB — ABO/RH: ABO/RH(D): O POS

## 2015-05-10 SURGERY — OPEN REDUCTION INTERNAL FIXATION (ORIF) TIBIAL PLATEAU
Anesthesia: General | Site: Leg Lower | Laterality: Left

## 2015-05-10 MED ORDER — PROPOFOL 10 MG/ML IV BOLUS
INTRAVENOUS | Status: AC
  Filled 2015-05-10: qty 40

## 2015-05-10 MED ORDER — ALPRAZOLAM 0.5 MG PO TABS
0.5000 mg | ORAL_TABLET | Freq: Three times a day (TID) | ORAL | Status: DC
  Administered 2015-05-10 – 2015-05-11 (×2): 0.5 mg via ORAL
  Filled 2015-05-10 (×3): qty 1

## 2015-05-10 MED ORDER — OXYCODONE-ACETAMINOPHEN 7.5-325 MG PO TABS
1.0000 | ORAL_TABLET | Freq: Four times a day (QID) | ORAL | Status: DC | PRN
  Administered 2015-05-10 – 2015-05-14 (×13): 2 via ORAL
  Filled 2015-05-10 (×15): qty 2

## 2015-05-10 MED ORDER — LACTATED RINGERS IV SOLN
INTRAVENOUS | Status: DC
  Administered 2015-05-10 (×3): via INTRAVENOUS

## 2015-05-10 MED ORDER — ROCURONIUM BROMIDE 100 MG/10ML IV SOLN
INTRAVENOUS | Status: DC | PRN
  Administered 2015-05-10: 40 mg via INTRAVENOUS

## 2015-05-10 MED ORDER — MIDAZOLAM HCL 2 MG/2ML IJ SOLN
INTRAMUSCULAR | Status: AC
  Filled 2015-05-10: qty 2

## 2015-05-10 MED ORDER — ONDANSETRON HCL 4 MG/2ML IJ SOLN
INTRAMUSCULAR | Status: DC | PRN
  Administered 2015-05-10 (×2): 4 mg via INTRAVENOUS

## 2015-05-10 MED ORDER — PROMETHAZINE HCL 25 MG/ML IJ SOLN: 6.2500 mg | INTRAMUSCULAR | Status: DC | PRN

## 2015-05-10 MED ORDER — 0.9 % SODIUM CHLORIDE (POUR BTL) OPTIME
TOPICAL | Status: DC | PRN
  Administered 2015-05-10: 1000 mL

## 2015-05-10 MED ORDER — ONDANSETRON HCL 4 MG PO TABS: 4.0000 mg | ORAL_TABLET | Freq: Four times a day (QID) | ORAL | Status: DC | PRN

## 2015-05-10 MED ORDER — MIDAZOLAM HCL 5 MG/5ML IJ SOLN
INTRAMUSCULAR | Status: DC | PRN
  Administered 2015-05-10: 2 mg via INTRAVENOUS

## 2015-05-10 MED ORDER — ENOXAPARIN SODIUM 40 MG/0.4ML ~~LOC~~ SOLN
40.0000 mg | SUBCUTANEOUS | Status: DC
  Administered 2015-05-11 – 2015-05-12 (×2): 40 mg via SUBCUTANEOUS
  Filled 2015-05-10 (×2): qty 0.4

## 2015-05-10 MED ORDER — GLYCOPYRROLATE 0.2 MG/ML IJ SOLN
INTRAMUSCULAR | Status: AC
  Filled 2015-05-10: qty 3

## 2015-05-10 MED ORDER — WARFARIN - PHARMACIST DOSING INPATIENT
Freq: Every day | Status: DC
  Administered 2015-05-11: 18:00:00

## 2015-05-10 MED ORDER — ONDANSETRON HCL 4 MG/2ML IJ SOLN: 4.0000 mg | Freq: Four times a day (QID) | INTRAMUSCULAR | Status: DC | PRN

## 2015-05-10 MED ORDER — MAGNESIUM CITRATE PO SOLN: 1.0000 | Freq: Once | ORAL | Status: DC | PRN

## 2015-05-10 MED ORDER — LIDOCAINE HCL (CARDIAC) 20 MG/ML IV SOLN
INTRAVENOUS | Status: DC | PRN
  Administered 2015-05-10: 100 mg via INTRAVENOUS

## 2015-05-10 MED ORDER — ACETAMINOPHEN 325 MG PO TABS: 650.0000 mg | ORAL_TABLET | Freq: Four times a day (QID) | ORAL | Status: DC | PRN

## 2015-05-10 MED ORDER — SORBITOL 70 % SOLN
30.0000 mL | Freq: Every day | Status: DC | PRN
Start: 2015-05-10 — End: 2015-05-14

## 2015-05-10 MED ORDER — HYDROMORPHONE HCL 1 MG/ML IJ SOLN
0.5000 mg | INTRAMUSCULAR | Status: DC | PRN
  Administered 2015-05-10 (×3): 0.5 mg via INTRAVENOUS

## 2015-05-10 MED ORDER — KETAMINE HCL 10 MG/ML IJ SOLN
INTRAMUSCULAR | Status: DC | PRN
  Administered 2015-05-10: 20 mg via INTRAVENOUS

## 2015-05-10 MED ORDER — GLYCOPYRROLATE 0.2 MG/ML IJ SOLN
INTRAMUSCULAR | Status: DC | PRN
  Administered 2015-05-10: 0.4 mg via INTRAVENOUS

## 2015-05-10 MED ORDER — WARFARIN SODIUM 7.5 MG PO TABS
7.5000 mg | ORAL_TABLET | Freq: Once | ORAL | Status: AC
  Administered 2015-05-10: 7.5 mg via ORAL
  Filled 2015-05-10: qty 1

## 2015-05-10 MED ORDER — PROPOFOL 10 MG/ML IV BOLUS
INTRAVENOUS | Status: DC | PRN
  Administered 2015-05-10: 40 mg via INTRAVENOUS
  Administered 2015-05-10: 150 mg via INTRAVENOUS
  Administered 2015-05-10: 50 mg via INTRAVENOUS

## 2015-05-10 MED ORDER — NEOSTIGMINE METHYLSULFATE 10 MG/10ML IV SOLN
INTRAVENOUS | Status: AC
  Filled 2015-05-10: qty 2

## 2015-05-10 MED ORDER — KETAMINE HCL 100 MG/ML IJ SOLN
INTRAMUSCULAR | Status: AC
  Filled 2015-05-10: qty 1

## 2015-05-10 MED ORDER — OXYCODONE HCL 5 MG PO TABS
10.0000 mg | ORAL_TABLET | ORAL | Status: DC | PRN
  Administered 2015-05-10: 20 mg via ORAL
  Administered 2015-05-11 (×2): 10 mg via ORAL
  Administered 2015-05-11 (×2): 20 mg via ORAL
  Administered 2015-05-11: 10 mg via ORAL
  Administered 2015-05-11: 20 mg via ORAL
  Administered 2015-05-11 (×2): 10 mg via ORAL
  Administered 2015-05-11 – 2015-05-12 (×3): 20 mg via ORAL
  Administered 2015-05-12: 15 mg via ORAL
  Administered 2015-05-12 – 2015-05-14 (×7): 20 mg via ORAL
  Administered 2015-05-14: 15 mg via ORAL
  Filled 2015-05-10 (×5): qty 4
  Filled 2015-05-10: qty 3
  Filled 2015-05-10: qty 2
  Filled 2015-05-10 (×3): qty 4
  Filled 2015-05-10: qty 3
  Filled 2015-05-10 (×4): qty 4
  Filled 2015-05-10: qty 2
  Filled 2015-05-10 (×5): qty 4
  Filled 2015-05-10: qty 2

## 2015-05-10 MED ORDER — KETOROLAC TROMETHAMINE 30 MG/ML IJ SOLN
INTRAMUSCULAR | Status: AC
  Administered 2015-05-10: 30 mg via INTRAVENOUS
  Filled 2015-05-10: qty 1

## 2015-05-10 MED ORDER — LACTATED RINGERS IV SOLN
INTRAVENOUS | Status: DC
  Administered 2015-05-10: 20:00:00 via INTRAVENOUS

## 2015-05-10 MED ORDER — MORPHINE SULFATE (PF) 2 MG/ML IV SOLN
2.0000 mg | INTRAVENOUS | Status: DC | PRN
  Administered 2015-05-10 – 2015-05-11 (×11): 2 mg via INTRAVENOUS
  Administered 2015-05-11: 3 mg via INTRAVENOUS
  Administered 2015-05-11 – 2015-05-12 (×2): 2 mg via INTRAVENOUS
  Filled 2015-05-10 (×11): qty 1
  Filled 2015-05-10: qty 2
  Filled 2015-05-10 (×3): qty 1

## 2015-05-10 MED ORDER — FENTANYL CITRATE (PF) 250 MCG/5ML IJ SOLN
INTRAMUSCULAR | Status: AC
  Filled 2015-05-10: qty 5

## 2015-05-10 MED ORDER — DOCUSATE SODIUM 100 MG PO CAPS
100.0000 mg | ORAL_CAPSULE | Freq: Two times a day (BID) | ORAL | Status: DC
  Administered 2015-05-10 – 2015-05-14 (×7): 100 mg via ORAL
  Filled 2015-05-10 (×7): qty 1

## 2015-05-10 MED ORDER — HYDROMORPHONE HCL 1 MG/ML IJ SOLN
INTRAMUSCULAR | Status: AC
  Administered 2015-05-10: 0.5 mg via INTRAVENOUS
  Filled 2015-05-10: qty 2

## 2015-05-10 MED ORDER — CEFAZOLIN SODIUM-DEXTROSE 2-3 GM-% IV SOLR
INTRAVENOUS | Status: DC | PRN
  Administered 2015-05-10: 2 g via INTRAVENOUS

## 2015-05-10 MED ORDER — ARTIFICIAL TEARS OP OINT
TOPICAL_OINTMENT | OPHTHALMIC | Status: DC | PRN
  Administered 2015-05-10: 1 via OPHTHALMIC

## 2015-05-10 MED ORDER — FENTANYL CITRATE (PF) 100 MCG/2ML IJ SOLN
INTRAMUSCULAR | Status: DC | PRN
  Administered 2015-05-10 (×2): 50 ug via INTRAVENOUS
  Administered 2015-05-10: 100 ug via INTRAVENOUS
  Administered 2015-05-10: 50 ug via INTRAVENOUS

## 2015-05-10 MED ORDER — HYDROXYZINE HCL 25 MG PO TABS
50.0000 mg | ORAL_TABLET | Freq: Four times a day (QID) | ORAL | Status: DC | PRN
  Administered 2015-05-11 – 2015-05-13 (×2): 50 mg via ORAL
  Filled 2015-05-10 (×2): qty 2

## 2015-05-10 MED ORDER — CEFAZOLIN SODIUM 1-5 GM-% IV SOLN
1.0000 g | Freq: Three times a day (TID) | INTRAVENOUS | Status: DC
  Administered 2015-05-10 – 2015-05-12 (×5): 1 g via INTRAVENOUS
  Filled 2015-05-10 (×6): qty 50

## 2015-05-10 MED ORDER — KETOROLAC TROMETHAMINE 30 MG/ML IJ SOLN
30.0000 mg | Freq: Once | INTRAMUSCULAR | Status: DC
  Administered 2015-05-10: 30 mg via INTRAVENOUS

## 2015-05-10 MED ORDER — SODIUM CHLORIDE 0.9 % IR SOLN
Status: DC | PRN
  Administered 2015-05-10: 3000 mL

## 2015-05-10 MED ORDER — NEOSTIGMINE METHYLSULFATE 10 MG/10ML IV SOLN
INTRAVENOUS | Status: DC | PRN
  Administered 2015-05-10: 3 mg via INTRAVENOUS

## 2015-05-10 MED ORDER — DIVALPROEX SODIUM 500 MG PO DR TAB
500.0000 mg | DELAYED_RELEASE_TABLET | Freq: Two times a day (BID) | ORAL | Status: DC
  Administered 2015-05-10 – 2015-05-14 (×8): 500 mg via ORAL
  Filled 2015-05-10 (×8): qty 1

## 2015-05-10 MED ORDER — METOCLOPRAMIDE HCL 5 MG PO TABS: 5.0000 mg | ORAL_TABLET | Freq: Three times a day (TID) | ORAL | Status: DC | PRN

## 2015-05-10 MED ORDER — ONDANSETRON HCL 4 MG/2ML IJ SOLN
INTRAMUSCULAR | Status: AC
  Filled 2015-05-10: qty 4

## 2015-05-10 MED ORDER — METOCLOPRAMIDE HCL 5 MG/ML IJ SOLN: 5.0000 mg | Freq: Three times a day (TID) | INTRAMUSCULAR | Status: DC | PRN

## 2015-05-10 MED ORDER — POLYETHYLENE GLYCOL 3350 17 G PO PACK
17.0000 g | PACK | Freq: Every day | ORAL | Status: DC
  Administered 2015-05-11 – 2015-05-14 (×3): 17 g via ORAL
  Filled 2015-05-10 (×5): qty 1

## 2015-05-10 MED ORDER — HYDROMORPHONE HCL 1 MG/ML IJ SOLN
0.2500 mg | INTRAMUSCULAR | Status: DC | PRN
  Administered 2015-05-10: 0.5 mg via INTRAVENOUS

## 2015-05-10 MED ORDER — ACETAMINOPHEN 650 MG RE SUPP: 650.0000 mg | Freq: Four times a day (QID) | RECTAL | Status: DC | PRN

## 2015-05-10 SURGICAL SUPPLY — 90 items
BANDAGE ELASTIC 4 VELCRO ST LF (GAUZE/BANDAGES/DRESSINGS) ×6 IMPLANT
BANDAGE ELASTIC 6 VELCRO ST LF (GAUZE/BANDAGES/DRESSINGS) ×6 IMPLANT
BANDAGE ESMARK 6X9 LF (GAUZE/BANDAGES/DRESSINGS) ×2 IMPLANT
BLADE SURG 10 STRL SS (BLADE) ×3 IMPLANT
BLADE SURG 15 STRL LF DISP TIS (BLADE) ×2 IMPLANT
BLADE SURG 15 STRL SS (BLADE) ×1
BLADE SURG ROTATE 9660 (MISCELLANEOUS) IMPLANT
BNDG COHESIVE 4X5 TAN STRL (GAUZE/BANDAGES/DRESSINGS) ×3 IMPLANT
BNDG ESMARK 6X9 LF (GAUZE/BANDAGES/DRESSINGS) ×3
BNDG GAUZE ELAST 4 BULKY (GAUZE/BANDAGES/DRESSINGS) ×6 IMPLANT
BONE VOID FILLER CERAMENT (Orthopedic Implant) ×3 IMPLANT
BRUSH SCRUB DISP (MISCELLANEOUS) ×6 IMPLANT
CANISTER SUCT 3000ML PPV (MISCELLANEOUS) ×3 IMPLANT
COVER MAYO STAND STRL (DRAPES) ×3 IMPLANT
COVER SURGICAL LIGHT HANDLE (MISCELLANEOUS) ×3 IMPLANT
DRAPE C-ARM 42X72 X-RAY (DRAPES) ×3 IMPLANT
DRAPE C-ARMOR (DRAPES) ×3 IMPLANT
DRAPE EXTREMITY T 121X128X90 (DRAPE) IMPLANT
DRAPE INCISE IOBAN 66X45 STRL (DRAPES) ×3 IMPLANT
DRAPE ORTHO SPLIT 77X108 STRL (DRAPES)
DRAPE SURG ORHT 6 SPLT 77X108 (DRAPES) IMPLANT
DRAPE U-SHAPE 47X51 STRL (DRAPES) ×3 IMPLANT
DRILL BIT 2.5X100 214235007 (MISCELLANEOUS) ×3 IMPLANT
DRSG ADAPTIC 3X8 NADH LF (GAUZE/BANDAGES/DRESSINGS) ×3 IMPLANT
DRSG MEPITEL 4X7.2 (GAUZE/BANDAGES/DRESSINGS) ×6 IMPLANT
DRSG PAD ABDOMINAL 8X10 ST (GAUZE/BANDAGES/DRESSINGS) ×3 IMPLANT
ELECT REM PT RETURN 9FT ADLT (ELECTROSURGICAL) ×3
ELECTRODE REM PT RTRN 9FT ADLT (ELECTROSURGICAL) ×2 IMPLANT
EVACUATOR 1/8 PVC DRAIN (DRAIN) IMPLANT
EVACUATOR 3/16  PVC DRAIN (DRAIN)
EVACUATOR 3/16 PVC DRAIN (DRAIN) IMPLANT
GAUZE SPONGE 4X4 12PLY STRL (GAUZE/BANDAGES/DRESSINGS) ×3 IMPLANT
GLOVE BIO SURGEON STRL SZ7.5 (GLOVE) ×3 IMPLANT
GLOVE BIO SURGEON STRL SZ8 (GLOVE) ×3 IMPLANT
GLOVE BIOGEL PI IND STRL 7.5 (GLOVE) ×2 IMPLANT
GLOVE BIOGEL PI IND STRL 8 (GLOVE) ×2 IMPLANT
GLOVE BIOGEL PI INDICATOR 7.5 (GLOVE) ×1
GLOVE BIOGEL PI INDICATOR 8 (GLOVE) ×1
GOWN STRL REUS W/ TWL LRG LVL3 (GOWN DISPOSABLE) ×4 IMPLANT
GOWN STRL REUS W/ TWL XL LVL3 (GOWN DISPOSABLE) ×2 IMPLANT
GOWN STRL REUS W/TWL LRG LVL3 (GOWN DISPOSABLE) ×2
GOWN STRL REUS W/TWL XL LVL3 (GOWN DISPOSABLE) ×1
IMMOBILIZER KNEE 20 (SOFTGOODS) ×3
IMMOBILIZER KNEE 20 THIGH 36 (SOFTGOODS) ×2 IMPLANT
IMMOBILIZER KNEE 22 UNIV (SOFTGOODS) ×3 IMPLANT
KIT BASIN OR (CUSTOM PROCEDURE TRAY) ×3 IMPLANT
KIT ROOM TURNOVER OR (KITS) ×3 IMPLANT
NDL SUT 6 .5 CRC .975X.05 MAYO (NEEDLE) IMPLANT
NEEDLE 22X1 1/2 (OR ONLY) (NEEDLE) IMPLANT
NEEDLE MAYO TAPER (NEEDLE)
NS IRRIG 1000ML POUR BTL (IV SOLUTION) ×3 IMPLANT
PACK ORTHO EXTREMITY (CUSTOM PROCEDURE TRAY) ×3 IMPLANT
PAD ARMBOARD 7.5X6 YLW CONV (MISCELLANEOUS) ×6 IMPLANT
PAD CAST 4YDX4 CTTN HI CHSV (CAST SUPPLIES) ×4 IMPLANT
PADDING CAST COTTON 4X4 STRL (CAST SUPPLIES) ×2
PADDING CAST COTTON 6X4 STRL (CAST SUPPLIES) ×6 IMPLANT
PLATE LOCK 5H STD LT PROX TIB (Plate) ×3 IMPLANT
SCREW CORT FT 32X3.5XNONLOCK (Screw) ×2 IMPLANT
SCREW CORTICAL 3.5MM  32MM (Screw) ×1 IMPLANT
SCREW CORTICAL 3.5MM  42MM (Screw) ×1 IMPLANT
SCREW CORTICAL 3.5MM 36MM (Screw) ×3 IMPLANT
SCREW CORTICAL 3.5MM 42MM (Screw) ×2 IMPLANT
SCREW LOCK 3.5X70 816135070 (Screw) ×6 IMPLANT
SCREW LOCK 3.5X75 816135075 DU (Screw) ×6 IMPLANT
SCREW LOCK CORT STAR 3.5X60 (Screw) ×3 IMPLANT
SCREW LP 3.5X75MM (Screw) ×6 IMPLANT
SPLINT FIBERGLASS 4X30 (CAST SUPPLIES) ×3 IMPLANT
SPLINT PLASTER CAST XFAST 5X30 (CAST SUPPLIES) ×4 IMPLANT
SPLINT PLASTER XFAST SET 5X30 (CAST SUPPLIES) ×2
SPONGE GAUZE 4X4 12PLY STER LF (GAUZE/BANDAGES/DRESSINGS) ×6 IMPLANT
SPONGE LAP 18X18 X RAY DECT (DISPOSABLE) ×3 IMPLANT
STAPLER VISISTAT 35W (STAPLE) ×3 IMPLANT
STOCKINETTE IMPERVIOUS LG (DRAPES) ×3 IMPLANT
SUCTION FRAZIER TIP 10 FR DISP (SUCTIONS) ×3 IMPLANT
SUT ETHILON 3 0 PS 1 (SUTURE) IMPLANT
SUT PROLENE 0 CT 2 (SUTURE) ×6 IMPLANT
SUT VIC AB 0 CT1 27 (SUTURE) ×1
SUT VIC AB 0 CT1 27XBRD ANBCTR (SUTURE) ×2 IMPLANT
SUT VIC AB 1 CT1 27 (SUTURE) ×1
SUT VIC AB 1 CT1 27XBRD ANBCTR (SUTURE) ×2 IMPLANT
SUT VIC AB 2-0 CT1 27 (SUTURE) ×2
SUT VIC AB 2-0 CT1 TAPERPNT 27 (SUTURE) ×4 IMPLANT
SYR 20ML ECCENTRIC (SYRINGE) IMPLANT
TOWEL OR 17X24 6PK STRL BLUE (TOWEL DISPOSABLE) ×3 IMPLANT
TOWEL OR 17X26 10 PK STRL BLUE (TOWEL DISPOSABLE) ×6 IMPLANT
TRAY FOLEY CATH 16FRSI W/METER (SET/KITS/TRAYS/PACK) IMPLANT
TUBE CONNECTING 12X1/4 (SUCTIONS) ×3 IMPLANT
WATER STERILE IRR 1000ML POUR (IV SOLUTION) ×6 IMPLANT
WIRE K 1.6MM 144256 (MISCELLANEOUS) ×6 IMPLANT
YANKAUER SUCT BULB TIP NO VENT (SUCTIONS) ×3 IMPLANT

## 2015-05-10 NOTE — Progress Notes (Signed)
Orthopaedic Trauma Service Progress Note  Subjective  Pt upset about poor communication Wondering why surgery was moved up Also complaining about how he is getting his medications States RN refused to give him percocet last night Girlfriend stated that nurse refused to give it to him bc he was dozing off i indicated that this is appropriate but did not see any notes indicating this   Pt note itching is much improved and essentially resolved   ROS As above   Objective   BP 145/76 mmHg  Pulse 110  Temp(Src) 100.3 F (37.9 C) (Oral)  Resp 20  Ht _0  (1.702 m)  Wt 65.772 kg (145 lb)  BMI 22.71 kg/m2  SpO2 98%  Intake/Output      11/30 0701 - 12/01 0700 12/01 0701 - 12/02 0700   P.O.     I.V. (mL/kg)  532 (8.1)   IV Piggyback 100    Total Intake(mL/kg) 100 (1.5) 532 (8.1)   Urine (mL/kg/hr) 1400 (0.9) 850 (4.5)   Total Output 1400 850   Net -1300 -318          Labs  Results for William Atkins, William Atkins (MRN [id number]) as of 05/10/2015 09:55  Ref. Range 05/10/2015 05:50  Sodium Latest Ref Range: 135-145 mmol/L 136  Potassium Latest Ref Range: 3.5-5.1 mmol/L 3.7  Chloride Latest Ref Range: 101-111 mmol/L 97 (L)  CO2 Latest Ref Range: 22-32 mmol/L 31  BUN Latest Ref Range: 6-20 mg/dL 6  Creatinine Latest Ref Range: 0.61-1.24 mg/dL 0.86  Calcium Latest Ref Range: 8.9-10.3 mg/dL 9.0  EGFR (Non-African Amer.) Latest Ref Range: >60 mL/min >60  EGFR (African American) Latest Ref Range: >60 mL/min >60  Glucose Latest Ref Range: 65-99 mg/dL 90  Anion gap Latest Ref Range: 5-15  8  WBC Latest Ref Range: 4.0-10.5 K/uL 14.5 (H)  RBC Latest Ref Range: 4.22-5.81 MIL/uL 3.81 (L)  Hemoglobin Latest Ref Range: 13.0-17.0 g/dL 11.7 (L)  HCT Latest Ref Range: 39.0-52.0 % 36.0 (L)  MCV Latest Ref Range: 78.0-100.0 fL 94.5  MCH Latest Ref Range: 26.0-34.0 pg 30.7  MCHC Latest Ref Range: 30.0-36.0 g/dL 32.5  RDW Latest Ref Range: 11.5-15.5 % 13.5  Platelets Latest Ref Range: 150-400 K/uL 209   Prothrombin Time Latest Ref Range: 11.6-15.2 seconds 13.1  INR Latest Ref Range: 0.00-1.49  0.97  APTT Latest Ref Range: 24-37 seconds 34   Exam  Gen: awake alert, NAD Ext:       B lower Extremities  Both splints/dressings are disheveled  No changes in exam o/w   Assessment and Plan   POD/HD#: 2  OR today for   ORIF Left tibial plateau   Repeat I&D L foot +/- ORIF L talus  +/- ORIF R calcaneus and likely SLC application R leg   Would like for nursing to document if pain meds are being held and why, include vitals   Scabies- improved after ivermectin, continue with contact precautions   Discussed very clearly, again, that he will be NWB B x 6-8 weeks. Concerned he will be noncompliant as he stated he stood up and brushed his teeth yesterday.    Jari Pigg, PA-C [facility] [phone] (O) 05/10/2015 9:52 AM

## 2015-05-10 NOTE — Progress Notes (Signed)
Orthopedic Tech Progress Note Patient Details:  William HoneyChris Giannone 06/21/1975 [id number]  Ortho Devices Type of Ortho Device: [name] Jones splint Ortho Device/Splint Location: applied ohf to bed Ortho Device/Splint Interventions: Ordered, Application   [name], [name] 05/10/2015, 7:37 PM

## 2015-05-10 NOTE — Transfer of Care (Signed)
Immediate Anesthesia Transfer of Care Note  Patient: William HoneyChris Atkins  Procedure(s) Performed: Procedure(s): OPEN REDUCTION INTERNAL FIXATION (ORIF) LEFT TIBIAL PLATEAU (Left) IRRIGATION AND DEBRIDEMENT EXTREMITY (Left) OPEN REDUCTION, INTERNAL FIXATION (ORIF) Talus Fracture  (Left)  Patient Location: PACU  Anesthesia Type:General  Level of Consciousness: awake, alert , oriented and patient cooperative  Airway & Oxygen Therapy: Patient Spontanous Breathing and Patient connected to face mask oxygen  Post-op Assessment: Report given to RN, Post -op Vital signs reviewed and stable and Patient moving all extremities  Post vital signs: Reviewed and stable  Last Vitals:  Filed Vitals:   05/10/15 0846 05/10/15 1738  BP: 145/76 157/86  Pulse: 110 120  Temp: 37.9 C   Resp: 20 12    Complications: No apparent anesthesia complications

## 2015-05-10 NOTE — Anesthesia Procedure Notes (Signed)
Procedure Name: Intubation Date/Time: 05/10/2015 2:21 PM Performed by: Fransisca KaufmannMEYER, Anjuli Gemmill E Pre-anesthesia Checklist: Patient identified, Emergency Drugs available, Suction available, Patient being monitored and Timeout performed Patient Re-evaluated:Patient Re-evaluated prior to inductionOxygen Delivery Method: Circle system utilized Preoxygenation: Pre-oxygenation with 100% oxygen Intubation Type: IV induction Ventilation: Mask ventilation without difficulty Laryngoscope Size: Miller and 3 Grade View: Grade I Tube type: Oral Tube size: 7.5 mm Number of attempts: 1 Airway Equipment and Method: Stylet Placement Confirmation: ETT inserted through vocal cords under direct vision,  positive ETCO2 and breath sounds checked- equal and bilateral Secured at: 23 cm Tube secured with: Tape Dental Injury: Teeth and Oropharynx as per pre-operative assessment

## 2015-05-10 NOTE — Anesthesia Postprocedure Evaluation (Signed)
Anesthesia Post Note  Patient: William Atkins  Procedure(s) Performed: Procedure(s) (LRB): OPEN REDUCTION INTERNAL FIXATION (ORIF) LEFT TIBIAL PLATEAU (Left) IRRIGATION AND DEBRIDEMENT EXTREMITY (Left) OPEN REDUCTION, INTERNAL FIXATION (ORIF) Talus Fracture  (Left)  Patient location during evaluation: PACU Anesthesia Type: General Level of consciousness: awake and alert Pain management: pain level controlled Vital Signs Assessment: post-procedure vital signs reviewed and stable Respiratory status: spontaneous breathing, nonlabored ventilation and respiratory function stable Cardiovascular status: blood pressure returned to baseline and stable Postop Assessment: no signs of nausea or vomiting Anesthetic complications: no    Last Vitals:  Filed Vitals:   05/10/15 1841 05/10/15 2055  BP:  104/69  Pulse:  94  Temp: 37.2 C 36.9 C  Resp:  16    Last Pain:  Filed Vitals:   05/10/15 2057  PainSc: 9     LLE Motor Response: Responds to commands LLE Sensation: Full sensation, Pain RLE Motor Response: Responds to commands RLE Sensation: Full sensation, Pain      Jemina Scahill A

## 2015-05-10 NOTE — Anesthesia Preprocedure Evaluation (Signed)
Anesthesia Evaluation  Patient identified by MRN, date of birth, ID band Patient awake    Reviewed: Allergy & Precautions, NPO status , Patient's Chart, lab work & pertinent test results  Airway Mallampati: II  TM Distance: >3 FB Neck ROM: Full    Dental no notable dental hx.    Pulmonary neg pulmonary ROS, former smoker,    Pulmonary exam normal breath sounds clear to auscultation       Cardiovascular negative cardio ROS Normal cardiovascular exam Rhythm:Regular Rate:Normal     Neuro/Psych Seizures -,  negative psych ROS   GI/Hepatic negative GI ROS, Neg liver ROS,   Endo/Other  negative endocrine ROS  Renal/GU negative Renal ROS  negative genitourinary   Musculoskeletal negative musculoskeletal ROS (+)   Abdominal   Peds negative pediatric ROS (+)  Hematology negative hematology ROS (+)   Anesthesia Other Findings   Reproductive/Obstetrics negative OB ROS                             Anesthesia Physical Anesthesia Plan  ASA: II  Anesthesia Plan: General   Post-op Pain Management:    Induction: Intravenous  Airway Management Planned: Oral ETT  Additional Equipment:   Intra-op Plan:   Post-operative Plan: Extubation in OR  Informed Consent: I have reviewed the patients History and Physical, chart, labs and discussed the procedure including the risks, benefits and alternatives for the proposed anesthesia with the patient or authorized representative who has indicated his/her understanding and acceptance.   Dental advisory given  Plan Discussed with: CRNA and Surgeon  Anesthesia Plan Comments:         Anesthesia Quick Evaluation
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
## 2015-05-11 ENCOUNTER — Encounter (HOSPITAL_COMMUNITY): Payer: Self-pay | Admitting: Orthopedic Surgery

## 2015-05-11 LAB — CBC WITH DIFFERENTIAL/PLATELET
Basophils Absolute: 0 10*3/uL (ref 0.0–0.1)
Basophils Relative: 0 %
Eosinophils Absolute: 0 10*3/uL (ref 0.0–0.7)
Eosinophils Relative: 0 %
HEMATOCRIT: 34.5 % — AB (ref 39.0–52.0)
HEMOGLOBIN: 11.1 g/dL — AB (ref 13.0–17.0)
LYMPHS PCT: 8 %
Lymphs Abs: 1.3 10*3/uL (ref 0.7–4.0)
MCH: 30.4 pg (ref 26.0–34.0)
MCHC: 32.2 g/dL (ref 30.0–36.0)
MCV: 94.5 fL (ref 78.0–100.0)
MONO ABS: 1.6 10*3/uL — AB (ref 0.1–1.0)
MONOS PCT: 10 %
NEUTROS ABS: 13.8 10*3/uL — AB (ref 1.7–7.7)
Neutrophils Relative %: 82 %
Platelets: 240 10*3/uL (ref 150–400)
RBC: 3.65 MIL/uL — ABNORMAL LOW (ref 4.22–5.81)
RDW: 13.2 % (ref 11.5–15.5)
WBC: 16.7 10*3/uL — ABNORMAL HIGH (ref 4.0–10.5)

## 2015-05-11 LAB — TSH: TSH: 2.466 u[IU]/mL (ref 0.350–4.500)

## 2015-05-11 LAB — PROTIME-INR
INR: 1.27 (ref 0.00–1.49)
Prothrombin Time: 16.1 seconds — ABNORMAL HIGH (ref 11.6–15.2)

## 2015-05-11 LAB — BASIC METABOLIC PANEL
ANION GAP: 11 (ref 5–15)
BUN: 7 mg/dL (ref 6–20)
CHLORIDE: 95 mmol/L — AB (ref 101–111)
CO2: 28 mmol/L (ref 22–32)
Calcium: 8.8 mg/dL — ABNORMAL LOW (ref 8.9–10.3)
Creatinine, Ser: 0.79 mg/dL (ref 0.61–1.24)
GFR calc Af Amer: >60 mL/min (ref >60)
GFR calc non Af Amer: >60 mL/min (ref >60)
GLUCOSE: 135 mg/dL — AB (ref 65–99)
POTASSIUM: 4.1 mmol/L (ref 3.5–5.1)
Sodium: 134 mmol/L — ABNORMAL LOW (ref 135–145)

## 2015-05-11 LAB — MAGNESIUM: Magnesium: 1.6 mg/dL — ABNORMAL LOW (ref 1.7–2.4)

## 2015-05-11 LAB — PHOSPHORUS: Phosphorus: 2.4 mg/dL — ABNORMAL LOW (ref 2.5–4.6)

## 2015-05-11 MED ORDER — WARFARIN VIDEO
Freq: Once | Status: AC
  Administered 2015-05-11: 15:00:00

## 2015-05-11 MED ORDER — DOCUSATE SODIUM 100 MG PO CAPS: 100.0000 mg | ORAL_CAPSULE | Freq: Two times a day (BID) | ORAL | Status: AC

## 2015-05-11 MED ORDER — COUMADIN BOOK
Freq: Once | Status: AC
  Administered 2015-05-11: 15:00:00
  Filled 2015-05-11: qty 1

## 2015-05-11 MED ORDER — ALPRAZOLAM 0.5 MG PO TABS
0.5000 mg | ORAL_TABLET | Freq: Three times a day (TID) | ORAL | Status: DC | PRN
  Administered 2015-05-13 (×2): 0.5 mg via ORAL
  Filled 2015-05-11 (×2): qty 1

## 2015-05-11 MED ORDER — OXYCODONE HCL 10 MG PO TABS: 10.0000 mg | ORAL_TABLET | Freq: Four times a day (QID) | ORAL | Status: AC | PRN

## 2015-05-11 MED ORDER — METHOCARBAMOL 500 MG PO TABS: 500.0000 mg | ORAL_TABLET | Freq: Four times a day (QID) | ORAL | Status: AC | PRN

## 2015-05-11 MED ORDER — WARFARIN SODIUM 7.5 MG PO TABS
7.5000 mg | ORAL_TABLET | Freq: Once | ORAL | Status: AC
  Administered 2015-05-11: 7.5 mg via ORAL
  Filled 2015-05-11: qty 1

## 2015-05-11 MED ORDER — OXYCODONE-ACETAMINOPHEN 7.5-325 MG PO TABS: 1.0000 | ORAL_TABLET | Freq: Four times a day (QID) | ORAL | Status: AC | PRN

## 2015-05-11 MED ORDER — WARFARIN SODIUM 5 MG PO TABS: 5.0000 mg | ORAL_TABLET | Freq: Every day | ORAL | Status: AC

## 2015-05-11 NOTE — Progress Notes (Signed)
Orthopedic Tech Progress Note Patient Details:  William HoneyChris Athanas 03/28/1976 [id number]  Patient ID: William Honeyhris Payton, male   DOB: 05/14/1976, 39 y.o.   MRN: [id number] Called in advanced brace order; spoke with Ferdinand LangoKanisha  [name] 05/11/2015, 10:23 AM

## 2015-05-11 NOTE — Progress Notes (Signed)
Orthopedic Tech Progress Note Patient Details:  William Atkins 10/26/1975 [id number]  Ortho Devices Type of Ortho Device:  (footsie roll) Ortho Device/Splint Location: lle Ortho Device/Splint Interventions: Application   [name] 05/11/2015, 10:38 AM
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
## 2015-05-11 NOTE — Discharge Instructions (Signed)
Orthopaedic Trauma Service Discharge Instructions   General Discharge Instructions  WEIGHT BEARING STATUS: Nonweightbearing bilateral legs  RANGE OF MOTION/ACTIVITY: Bilateral knee range of motion as tolerated. You will be unable to do ankle range of motion as you are casted and splinted  Wound Care: Keep cast and splint clean and dry. Do not remove dressings. These will be removed at first postoperative visit  PAIN MEDICATION USE AND EXPECTATIONS  You have likely been given narcotic medications to help control your pain.  After a traumatic event that results in an fracture (broken bone) with or without surgery, it is ok to use narcotic pain medications to help control one's pain.  We understand that everyone responds to pain differently and each individual patient will be evaluated on a regular basis for the continued need for narcotic medications. Ideally, narcotic medication use should last no more than 6-8 weeks (coinciding with fracture healing).   As a patient it is your responsibility as well to monitor narcotic medication use and report the amount and frequency you use these medications when you come to your office visit.   We would also advise that if you are using narcotic medications, you should take a dose prior to therapy to maximize you participation.  IF YOU ARE ON NARCOTIC MEDICATIONS IT IS NOT PERMISSIBLE TO OPERATE A MOTOR VEHICLE (MOTORCYCLE/CAR/TRUCK/MOPED) OR HEAVY MACHINERY DO NOT MIX NARCOTICS WITH OTHER CNS (CENTRAL NERVOUS SYSTEM) DEPRESSANTS SUCH AS ALCOHOL  Diet: as you were eating previously.  Can use over the counter stool softeners and bowel preparations, such as Miralax, to help with bowel movements.  Narcotics can be constipating.  Be sure to drink plenty of fluids    STOP SMOKING OR USING NICOTINE PRODUCTS!!!!  As discussed nicotine severely impairs your body's ability to heal surgical and traumatic wounds but also impairs bone healing.  Wounds and bone heal  by forming microscopic blood vessels (angiogenesis) and nicotine is a vasoconstrictor (essentially, shrinks blood vessels).  Therefore, if vasoconstriction occurs to these microscopic blood vessels they essentially disappear and are unable to deliver necessary nutrients to the healing tissue.  This is one modifiable factor that you can do to dramatically increase your chances of healing your injury.    (This means no smoking, no nicotine gum, patches, etc)  DO NOT USE NONSTEROIDAL ANTI-INFLAMMATORY DRUGS (NSAID'S)  Using products such as Advil (ibuprofen), Aleve (naproxen), Motrin (ibuprofen) for additional pain control during fracture healing can delay and/or prevent the healing response.  If you would like to take over the counter (OTC) medication, Tylenol (acetaminophen) is ok.  However, some narcotic medications that are given for pain control contain acetaminophen as well. Therefore, you should not exceed more than 4000 mg of tylenol in a day if you do not have liver disease.  Also note that there are may OTC medicines, such as cold medicines and allergy medicines that my contain tylenol as well.  If you have any questions about medications and/or interactions please ask your doctor/PA or your pharmacist.      ICE AND ELEVATE INJURED/OPERATIVE EXTREMITY  Using ice and elevating the injured extremity above your heart can help with swelling and pain control.  Icing in a pulsatile fashion, such as 20 minutes on and 20 minutes off, can be followed.    Do not place ice directly on skin. Make sure there is a barrier between to skin and the ice pack.    Using frozen items such as frozen peas works well as the conform  nicely to the are that needs to be iced.  USE AN ACE WRAP OR TED HOSE FOR SWELLING CONTROL  In addition to icing and elevation, Ace wraps or TED hose are used to help limit and resolve swelling.  It is recommended to use Ace wraps or TED hose until you are informed to stop.    When using Ace  Wraps start the wrapping distally (farthest away from the body) and wrap proximally (closer to the body)   Example: If you had surgery on your leg or thing and you do not have a splint on, start the ace wrap at the toes and work your way up to the thigh        If you had surgery on your upper extremity and do not have a splint on, start the ace wrap at your fingers and work your way up to the upper arm  IF YOU ARE IN A SPLINT OR CAST DO NOT REMOVE IT FOR ANY REASON   If your splint gets wet for any reason please contact the office immediately. You may shower in your splint or cast as long as you keep it dry.  This can be done by wrapping in a cast cover or garbage back (or similar)  Do Not stick any thing down your splint or cast such as pencils, money, or hangers to try and scratch yourself with.  If you feel itchy take benadryl as prescribed on the bottle for itching  IF YOU ARE IN A CAM BOOT (BLACK BOOT)  You may remove boot periodically. Perform daily dressing changes as noted below.  Wash the liner of the boot regularly and wear a sock when wearing the boot. It is recommended that you sleep in the boot until told otherwise  CALL THE OFFICE WITH ANY QUESTIONS OR CONCERTS: [phone]  Information on my medicine - Coumadin   (Warfarin)  This medication education was reviewed with me or my healthcare representative as part of my discharge preparation.  The pharmacist that spoke with me during my hospital stay was:  Rolley Sims, [facility]  Why was Coumadin prescribed for you? Coumadin was prescribed for you because you have a blood clot or a medical condition that can cause an increased risk of forming blood clots. Blood clots can cause serious health problems by blocking the flow of blood to the heart, lung, or brain. Coumadin can prevent harmful blood clots from forming. As a reminder your indication for Coumadin is:   Blood Clot Prevention After Orthopedic Surgery  What test will check  on my response to Coumadin? While on Coumadin (warfarin) you will need to have an INR test regularly to ensure that your dose is keeping you in the desired range. The INR (international normalized ratio) number is calculated from the result of the laboratory test called prothrombin time (PT).  If an INR APPOINTMENT HAS NOT ALREADY BEEN MADE FOR YOU please schedule an appointment to have this lab work done by your health care provider within 7 days. Your INR goal is usually a number between:  2 to 3 or your provider may give you a more narrow range like 2-2.5.  Ask your health care provider during an office visit what your goal INR is.  What  do you need to  know  About  COUMADIN? Take Coumadin (warfarin) exactly as prescribed by your healthcare provider about the same time each day.  DO NOT stop taking without talking to the doctor who prescribed the  medication.  Stopping without other blood clot prevention medication to take the place of Coumadin may increase your risk of developing a new clot or stroke.  Get refills before you run out.  What do you do if you miss a dose? If you miss a dose, take it as soon as you remember on the same day then continue your regularly scheduled regimen the next day.  Do not take two doses of Coumadin at the same time.  Important Safety Information A possible side effect of Coumadin (Warfarin) is an increased risk of bleeding. You should call your healthcare provider right away if you experience any of the following: ? Bleeding from an injury or your nose that does not stop. ? Unusual colored urine (red or dark brown) or unusual colored stools (red or black). ? Unusual bruising for unknown reasons. ? A serious fall or if you hit your head (even if there is no bleeding).  Some foods or medicines interact with Coumadin (warfarin) and might alter your response to warfarin. To help avoid this: ? Eat a balanced diet, maintaining a consistent amount of Vitamin  K. ? Notify your provider about major diet changes you plan to make. ? Avoid alcohol or limit your intake to 1 drink for women and 2 drinks for men per day. (1 drink is 5 oz. wine, 12 oz. beer, or 1.5 oz. liquor.)  Make sure that ANY health care provider who prescribes medication for you knows that you are taking Coumadin (warfarin).  Also make sure the healthcare provider who is monitoring your Coumadin knows when you have started a new medication including herbals and non-prescription products.  Coumadin (Warfarin)  Major Drug Interactions  Increased Warfarin Effect Decreased Warfarin Effect  Alcohol (large quantities) Antibiotics (esp. Septra/Bactrim, Flagyl, Cipro) Amiodarone (Cordarone) Aspirin (ASA) Cimetidine (Tagamet) Megestrol (Megace) NSAIDs (ibuprofen, naproxen, etc.) Piroxicam (Feldene) Propafenone (Rythmol SR) Propranolol (Inderal) Isoniazid (INH) Posaconazole (Noxafil) Barbiturates (Phenobarbital) Carbamazepine (Tegretol) Chlordiazepoxide (Librium) Cholestyramine (Questran) Griseofulvin Oral Contraceptives Rifampin Sucralfate (Carafate) Vitamin K   Coumadin (Warfarin) Major Herbal Interactions  Increased Warfarin Effect Decreased Warfarin Effect  Garlic Ginseng Ginkgo biloba Coenzyme Q10 Green tea St. Johns wort    Coumadin (Warfarin) FOOD Interactions  Eat a consistent number of servings per week of foods HIGH in Vitamin K (1 serving =  cup)  Collards (cooked, or boiled & drained) Kale (cooked, or boiled & drained) Mustard greens (cooked, or boiled & drained) Parsley *serving size only =  cup Spinach (cooked, or boiled & drained) Swiss chard (cooked, or boiled & drained) Turnip greens (cooked, or boiled & drained)  Eat a consistent number of servings per week of foods MEDIUM-HIGH in Vitamin K (1 serving = 1 cup)  Asparagus (cooked, or boiled & drained) Broccoli (cooked, boiled & drained, or raw & chopped) Brussel sprouts (cooked, or boiled &  drained) *serving size only =  cup Lettuce, raw (green leaf, endive, romaine) Spinach, raw Turnip greens, raw & chopped   These websites have more information on Coumadin (warfarin):  http://www.king-russell.com/; https://www.hines.net/;
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
## 2015-05-11 NOTE — Care Management (Signed)
Utilization review completed. Rayshawn Visconti, RN Case Manager [phone]. 

## 2015-05-12 ENCOUNTER — Inpatient Hospital Stay (HOSPITAL_COMMUNITY): Payer: No Typology Code available for payment source

## 2015-05-12 LAB — CBC WITH DIFFERENTIAL/PLATELET
BASOS PCT: 0 %
Basophils Absolute: 0 10*3/uL (ref 0.0–0.1)
EOS ABS: 0.3 10*3/uL (ref 0.0–0.7)
EOS PCT: 2 %
HCT: 33.1 % — ABNORMAL LOW (ref 39.0–52.0)
Hemoglobin: 10.8 g/dL — ABNORMAL LOW (ref 13.0–17.0)
LYMPHS ABS: 1.7 10*3/uL (ref 0.7–4.0)
Lymphocytes Relative: 9 %
MCH: 30.7 pg (ref 26.0–34.0)
MCHC: 32.6 g/dL (ref 30.0–36.0)
MCV: 94 fL (ref 78.0–100.0)
MONOS PCT: 12 %
Monocytes Absolute: 2.2 10*3/uL — ABNORMAL HIGH (ref 0.1–1.0)
NEUTROS PCT: 77 %
Neutro Abs: 13.9 10*3/uL — ABNORMAL HIGH (ref 1.7–7.7)
Platelets: 273 10*3/uL (ref 150–400)
RBC: 3.52 MIL/uL — ABNORMAL LOW (ref 4.22–5.81)
RDW: 13.1 % (ref 11.5–15.5)
WBC: 18.2 10*3/uL — AB (ref 4.0–10.5)

## 2015-05-12 LAB — VITAMIN D 25 HYDROXY (VIT D DEFICIENCY, FRACTURES): Vit D, 25-Hydroxy: 25.4 ng/mL — ABNORMAL LOW (ref 30.0–100.0)

## 2015-05-12 LAB — BASIC METABOLIC PANEL
ANION GAP: 8 (ref 5–15)
Anion gap: 8 (ref 5–15)
BUN: 8 mg/dL (ref 6–20)
BUN: 6 mg/dL (ref 6–20)
CALCIUM: 8.3 mg/dL — AB (ref 8.9–10.3)
CHLORIDE: 93 mmol/L — AB (ref 101–111)
CO2: 30 mmol/L (ref 22–32)
CO2: 32 mmol/L (ref 22–32)
Calcium: 8.7 mg/dL — ABNORMAL LOW (ref 8.9–10.3)
Chloride: 93 mmol/L — ABNORMAL LOW (ref 101–111)
Creatinine, Ser: 0.75 mg/dL (ref 0.61–1.24)
Creatinine, Ser: 0.88 mg/dL (ref 0.61–1.24)
GFR calc Af Amer: >60 mL/min (ref >60)
GFR calc Af Amer: >60 mL/min (ref >60)
GFR calc non Af Amer: >60 mL/min (ref >60)
GLUCOSE: 122 mg/dL — AB (ref 65–99)
GLUCOSE: 115 mg/dL — AB (ref 65–99)
POTASSIUM: 5.3 mmol/L — AB (ref 3.5–5.1)
POTASSIUM: 4 mmol/L (ref 3.5–5.1)
SODIUM: 131 mmol/L — AB (ref 135–145)
Sodium: 133 mmol/L — ABNORMAL LOW (ref 135–145)

## 2015-05-12 LAB — PROTIME-INR
INR: 2.34 — ABNORMAL HIGH (ref 0.00–1.49)
PROTHROMBIN TIME: 25.4 s — AB (ref 11.6–15.2)

## 2015-05-12 LAB — PTH, INTACT AND CALCIUM
Calcium, Total (PTH): 8.5 mg/dL — ABNORMAL LOW (ref 8.7–10.2)
PTH: 19 pg/mL (ref 15–65)

## 2015-05-12 MED ORDER — CEFAZOLIN SODIUM-DEXTROSE 2-3 GM-% IV SOLR
2.0000 g | Freq: Three times a day (TID) | INTRAVENOUS | Status: DC
  Administered 2015-05-12 – 2015-05-13 (×3): 2 g via INTRAVENOUS
  Filled 2015-05-12 (×5): qty 50

## 2015-05-12 MED ORDER — MAGNESIUM OXIDE 400 (241.3 MG) MG PO TABS
400.0000 mg | ORAL_TABLET | Freq: Every day | ORAL | Status: DC
  Administered 2015-05-12 – 2015-05-14 (×3): 400 mg via ORAL
  Filled 2015-05-12 (×3): qty 1
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
## 2015-05-12 NOTE — Progress Notes (Signed)
Discussed pain regime with pt's and pt's girlfriend. Explained to pt and pt's girlfriend that IV pain medication would likely not be prescribed for home administration and the importance of weaning pt away from/off IV medication while here. Orders indicate possible d/c on Sunday 12/4. Pt and pt's girlfriend verbalized understanding of this. Pt stated that he was willing to try PO pain medications only from this point forward. A plan was made regarding next doses of PRN medications and due times. VSS remain stable. O2 remains at 4L nasal cannula; sats remain in mid-upper 90s. Nursing will continue to monitor.

## 2015-05-12 NOTE — Progress Notes (Signed)
Patient requested for percocet and complained that he supposed to have it at 11am. I explained that percocet is a PRN meds per MD order on his MAR, and it was not scheduled to gave when it was due. I gave his percocet 2 tab 7.5/325 @ 12pm. And patient requested me to bring his OxyContin 15 mg within one hour. I called the on call PA Kirstin to get further instructions on his pain meds. Kirstin instructed me to give his pain meds every 3 hours in between. Patient got upset on it and demanded me to asked the MD to come into patient room. I paged the on call MD. PA Kirstin was acknowledged and she said she would talk to the patient on the phone. The patient's girlfriend was upset and had negative atittude toward the nursing staff. Charge nurse Koleen Nimroddrian was aware with the situation. Will continue monitor patient.
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
## 2015-05-12 NOTE — Progress Notes (Signed)
Got a phone call from desk and was told patient need his pain meds. Upon into patient room, his saturation dropped to 80's on room air when he is resting in bed . Reminded patient to keep deep breath and use his SI every 15 mins while he is awake. Educated patient about the side effects of Narcotics. Patient was eager to go home and waiting for the WBC result.
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
## 2015-05-12 NOTE — Progress Notes (Signed)
Pt due for PO robaxin, PO norco, and IV morpine PRN medciations at this time. Pt in deep sleep and difficult to arouse by both this RN and pt's wife. VSS and pt appears to be in no distress; pt is responsive to painful stimuli. Continuous pulse ox on, sats ranging 93-96% with 3L nasal cannula. O2 sats briefly dipped into the mid 80's as this RN attempted to leave room. O2 increased to 4L for safety. O2 sats returned to 96%. No PRN narcotics/pain medication administered at this time due to concern for potential over-sedation. Call bell and phone within reach. Nursing will continue to monitor.
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
## 2015-05-13 ENCOUNTER — Encounter (HOSPITAL_COMMUNITY): Payer: Self-pay | Admitting: Physician Assistant

## 2015-05-13 DIAGNOSIS — J159 Unspecified bacterial pneumonia: Secondary | ICD-10-CM | POA: Diagnosis not present

## 2015-05-13 HISTORY — DX: Unspecified bacterial pneumonia: J15.9

## 2015-05-13 LAB — BASIC METABOLIC PANEL
ANION GAP: 7 (ref 5–15)
BUN: 7 mg/dL (ref 6–20)
CALCIUM: 8.6 mg/dL — AB (ref 8.9–10.3)
CO2: 32 mmol/L (ref 22–32)
CREATININE: 0.8 mg/dL (ref 0.61–1.24)
Chloride: 94 mmol/L — ABNORMAL LOW (ref 101–111)
Glucose, Bld: 100 mg/dL — ABNORMAL HIGH (ref 65–99)
Potassium: 3.9 mmol/L (ref 3.5–5.1)
SODIUM: 133 mmol/L — AB (ref 135–145)

## 2015-05-13 LAB — PROTIME-INR
INR: 2.43 — AB (ref 0.00–1.49)
PROTHROMBIN TIME: 26.1 s — AB (ref 11.6–15.2)

## 2015-05-13 MED ORDER — ENOXAPARIN SODIUM 40 MG/0.4ML ~~LOC~~ SOLN: 40.0000 mg | SUBCUTANEOUS | Status: DC

## 2015-05-13 MED ORDER — DEXTROSE 5 % IV SOLN
1.0000 g | Freq: Three times a day (TID) | INTRAVENOUS | Status: DC
  Administered 2015-05-13 – 2015-05-14 (×3): 1 g via INTRAVENOUS
  Filled 2015-05-13 (×5): qty 1

## 2015-05-13 MED ORDER — WARFARIN SODIUM 4 MG PO TABS
4.0000 mg | ORAL_TABLET | Freq: Once | ORAL | Status: AC
  Administered 2015-05-13: 4 mg via ORAL
  Filled 2015-05-13: qty 1

## 2015-05-13 MED ORDER — VANCOMYCIN HCL IN DEXTROSE 1-5 GM/200ML-% IV SOLN
1000.0000 mg | Freq: Three times a day (TID) | INTRAVENOUS | Status: DC
  Administered 2015-05-13 – 2015-05-14 (×2): 1000 mg via INTRAVENOUS
  Filled 2015-05-13 (×5): qty 200

## 2015-05-13 NOTE — Progress Notes (Signed)
Subjective: 3 Days Post-Op Procedure(s) (LRB): OPEN REDUCTION INTERNAL FIXATION (ORIF) LEFT TIBIAL PLATEAU (Left) IRRIGATION AND DEBRIDEMENT EXTREMITY (Left) OPEN REDUCTION, INTERNAL FIXATION (ORIF) Talus Fracture  (Left) Patient reports pain as 4 on 0-10 scale.    Objective: Vital signs in last 24 hours: Temp:  [97.9 F (36.6 C)-99.5 F (37.5 C)] 97.9 F (36.6 C) (12/04 0706) Pulse Rate:  [87-101] 87 (12/04 0706) Resp:  [17-19] 17 (12/04 0706) BP: (104-138)/(68-87) 131/84 mmHg (12/04 0706) SpO2:  [95 %-99 %] 99 % (12/04 0706)  Intake/Output from previous day: 12/03 0701 - 12/04 0700 In: 1070 [P.O.:1020; IV Piggyback:50] Out: -  Intake/Output this shift:     Recent Labs  05/11/15 0803 05/12/15 1110  HGB 11.1* 10.8*    Recent Labs  05/11/15 0803 05/12/15 1110  WBC 16.7* 18.2*  RBC 3.65* 3.52*  HCT 34.5* 33.1*  PLT 240 273    Recent Labs  05/12/15 1110 05/13/15 0340  NA 133* 133*  K 4.0 3.9  CL 93* 94*  CO2 32 32  BUN 6 7  CREATININE 0.88 0.80  GLUCOSE 115* 100*  CALCIUM 8.7* 8.6*    Recent Labs  05/12/15 0425 05/13/15 0340  INR 2.34* 2.43*    ABD soft Neurovascular intact Sensation intact distally Intact pulses distally Dorsiflexion/Plantar flexion intact Incision: dressing C/D/I  Lungs decreased breath sounds bilaterally  Assessment/Plan: 3 Days Post-Op Procedure(s) (LRB): OPEN REDUCTION INTERNAL FIXATION (ORIF) LEFT TIBIAL PLATEAU (Left) IRRIGATION AND DEBRIDEMENT EXTREMITY (Left) OPEN REDUCTION, INTERNAL FIXATION (ORIF) Talus Fracture  (Left)  Principal Problem:   Open fracture of left talus Active Problems:   Fracture of tibial plateau, closed, Left    Scabies   Calcaneus fracture, right   Closed navicular fracture of right foot   Seizures (HCC)   HABP (hospital-acquired bacterial pneumonia)  Advance diet Up with therapy  Discharge was held yesterday due to rising WBC with a shift.  Chest xray showed pneumonia.  Started  Elita QuickFortaz and Vanc today for HAP.  Will continue to follow.  William Atkins and William Atkins will see on Monday am.  Pascal LuxSHEPPERSON,William Standish J 05/13/2015, 9:55 AM
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
## 2015-05-14 ENCOUNTER — Encounter (HOSPITAL_COMMUNITY): Payer: Self-pay | Admitting: Orthopedic Surgery

## 2015-05-14 DIAGNOSIS — S93314A Dislocation of tarsal joint of right foot, initial encounter: Secondary | ICD-10-CM

## 2015-05-14 HISTORY — DX: Dislocation of tarsal joint of right foot, initial encounter: S93.314A

## 2015-05-14 LAB — BASIC METABOLIC PANEL
Anion gap: 9 (ref 5–15)
BUN: 8 mg/dL (ref 6–20)
CHLORIDE: 94 mmol/L — AB (ref 101–111)
CO2: 29 mmol/L (ref 22–32)
Calcium: 8.6 mg/dL — ABNORMAL LOW (ref 8.9–10.3)
Creatinine, Ser: 0.78 mg/dL (ref 0.61–1.24)
GFR calc non Af Amer: >60 mL/min (ref >60)
Glucose, Bld: 126 mg/dL — ABNORMAL HIGH (ref 65–99)
POTASSIUM: 3.6 mmol/L (ref 3.5–5.1)
SODIUM: 132 mmol/L — AB (ref 135–145)

## 2015-05-14 LAB — CBC WITH DIFFERENTIAL/PLATELET
BASOS ABS: 0 10*3/uL (ref 0.0–0.1)
BASOS PCT: 0 %
EOS ABS: 0.4 10*3/uL (ref 0.0–0.7)
Eosinophils Relative: 3 %
HEMATOCRIT: 31.8 % — AB (ref 39.0–52.0)
HEMOGLOBIN: 10.3 g/dL — AB (ref 13.0–17.0)
LYMPHS PCT: 14 %
Lymphs Abs: 1.7 10*3/uL (ref 0.7–4.0)
MCH: 30 pg (ref 26.0–34.0)
MCHC: 32.4 g/dL (ref 30.0–36.0)
MCV: 92.7 fL (ref 78.0–100.0)
MONOS PCT: 11 %
Monocytes Absolute: 1.3 10*3/uL — ABNORMAL HIGH (ref 0.1–1.0)
NEUTROS ABS: 8.5 10*3/uL — AB (ref 1.7–7.7)
NEUTROS PCT: 72 %
Platelets: 354 10*3/uL (ref 150–400)
RBC: 3.43 MIL/uL — ABNORMAL LOW (ref 4.22–5.81)
RDW: 13.3 % (ref 11.5–15.5)
WBC: 11.9 10*3/uL — ABNORMAL HIGH (ref 4.0–10.5)

## 2015-05-14 LAB — PROTIME-INR
INR: 1.76 — ABNORMAL HIGH (ref 0.00–1.49)
PROTHROMBIN TIME: 20.5 s — AB (ref 11.6–15.2)

## 2015-05-14 MED ORDER — WARFARIN SODIUM 6 MG PO TABS
6.0000 mg | ORAL_TABLET | Freq: Once | ORAL | Status: DC
  Filled 2015-05-14: qty 1

## 2015-05-14 MED ORDER — LEVOFLOXACIN 500 MG PO TABS: 750.0000 mg | ORAL_TABLET | Freq: Every day | ORAL | Status: DC

## 2015-05-14 MED ORDER — ASCORBIC ACID 500 MG PO TABS: 500.0000 mg | ORAL_TABLET | Freq: Every day | ORAL | Status: AC

## 2015-05-14 MED ORDER — VITAMIN C 500 MG PO TABS
500.0000 mg | ORAL_TABLET | Freq: Every day | ORAL | Status: DC
  Administered 2015-05-14: 500 mg via ORAL
  Filled 2015-05-14: qty 1

## 2015-05-14 MED ORDER — CHOLECALCIFEROL 50 MCG (2000 UT) PO TABS: 2000.0000 [IU] | ORAL_TABLET | Freq: Two times a day (BID) | ORAL | Status: AC

## 2015-05-14 MED ORDER — VITAMIN D 1000 UNITS PO TABS
2000.0000 [IU] | ORAL_TABLET | Freq: Two times a day (BID) | ORAL | Status: DC
  Administered 2015-05-14: 2000 [IU] via ORAL
  Filled 2015-05-14: qty 2

## 2015-05-14 MED ORDER — LEVOFLOXACIN 750 MG PO TABS: 750.0000 mg | ORAL_TABLET | Freq: Every day | ORAL | Status: AC
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
## 2015-05-14 NOTE — Care Management (Signed)
Case manager [facility] and explained that he will need to present it to one of the pharmacies on the list provided. CM also informed him that narcotics will not be covered. Case manager also reminded patient to go to [facility] for his bloodwork and the results will be faxed to Dr. Magdalene PatriciaHandy's office.

## 2015-05-17 LAB — OPIATES,MS,WB/SP RFX
6-ACETYLMORPHINE: NEGATIVE
CODEINE: NEGATIVE ng/mL
Dihydrocodeine: NEGATIVE ng/mL
Hydrocodone: NEGATIVE ng/mL
Hydromorphone: NEGATIVE ng/mL
MORPHINE: 1 ng/mL
OPIATE CONFIRMATION: POSITIVE

## 2015-05-17 LAB — OXYCODONES,MS,WB/SP RFX
OXYMORPHONE: 1.5 ng/mL
Oxycocone: 16.7 ng/mL
Oxycodones Confirmation: POSITIVE

## 2015-05-22 LAB — DRUG SCREEN 10 W/CONF, SERUM
Amphetamines, IA: NEGATIVE ng/mL
BARBITURATES, IA: NEGATIVE ug/mL
Benzodiazepines, IA: NEGATIVE ng/mL
COCAINE & METABOLITE, IA: NEGATIVE ng/mL
Methadone, IA: NEGATIVE ng/mL
Opiates, IA: POSITIVE ng/mL
Oxycodones, IA: POSITIVE ng/mL
PHENCYCLIDINE, IA: NEGATIVE ng/mL
PROPOXYPHENE, IA: NEGATIVE ng/mL
THC(Marijuana) Metabolite, IA: POSITIVE ng/mL

## 2015-05-22 LAB — THC,MS,WB/SP RFX
CANNABINOL: NEGATIVE ng/mL
Cannabidiol: NEGATIVE ng/mL
Cannabinoid Confirmation: POSITIVE
Carboxy-THC: 19.8 ng/mL
HYDROXY-THC: NEGATIVE ng/mL
Tetrahydrocannabinol(THC): NEGATIVE ng/mL

## 2017-07-15 IMAGING — RF DG FOOT 2V*R*
1 series · 1 of 1 positions shown · non-contrast
Comparison: 05/08/2015

CLINICAL DATA: Fracture of the talus, status post cast placement.

EXAM:
RIGHT FOOT - 2 VIEW

[Series 1: run · 1 of 1 slices shown]
[im 1/1]
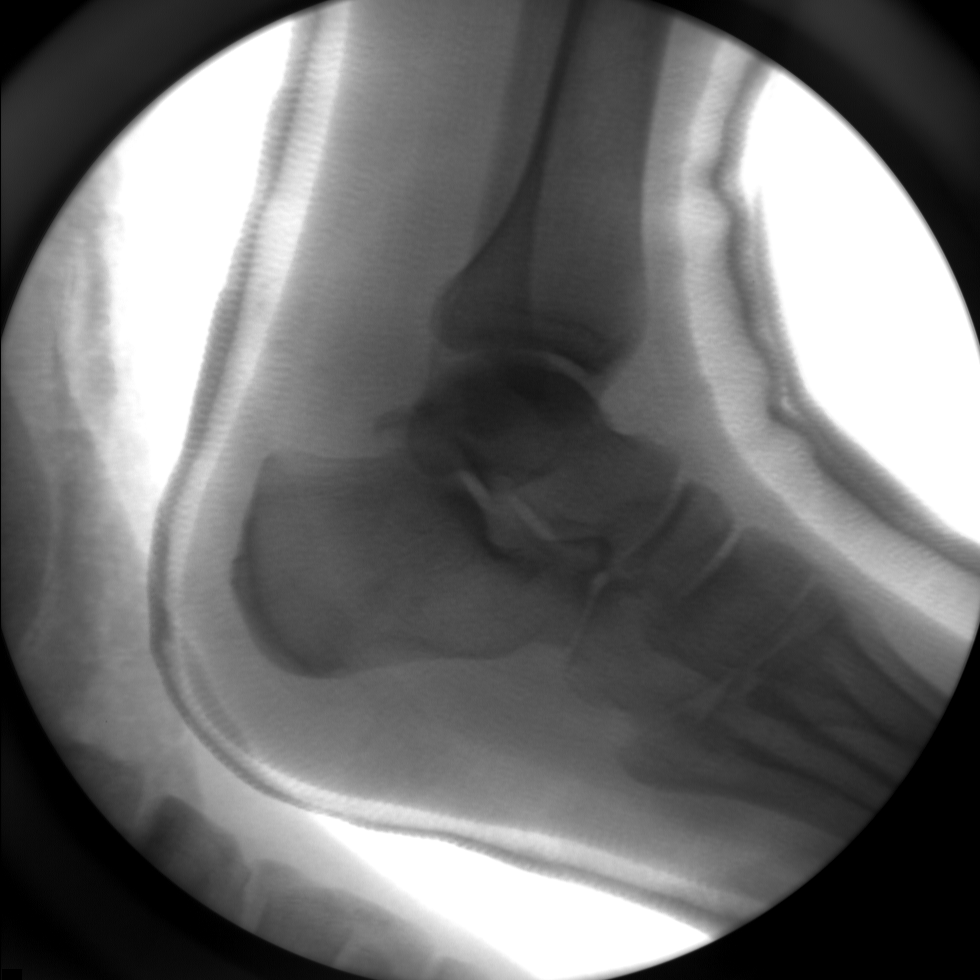

[1 of 1 positions shown; findings below may reference images not displayed]

FINDINGS: Fractures seen on the prior study are not well-defined. The subtle
fracture from anterior dorsal talus is unchanged.

Ankle mortise is normally spaced and aligned. Ankle is encased in a
fiberglass cast.
IMPRESSION: Fractures not well-defined on the current study due to the
surrounding fiberglass cast. No convincing change or displacement of
the previously described fractures.

## 2017-07-15 IMAGING — RF DG ANKLE 2V *L*
1 series · 1 of 1 positions shown · non-contrast
Comparison: None

FLUOROSCOPY TIME:  34 seconds

CLINICAL DATA: Open reduction of Abimelk Tiger fracture

EXAM:
LEFT ANKLE - 2 VIEW

[Series 1: run · 1 of 1 slices shown]
[im 1/1]
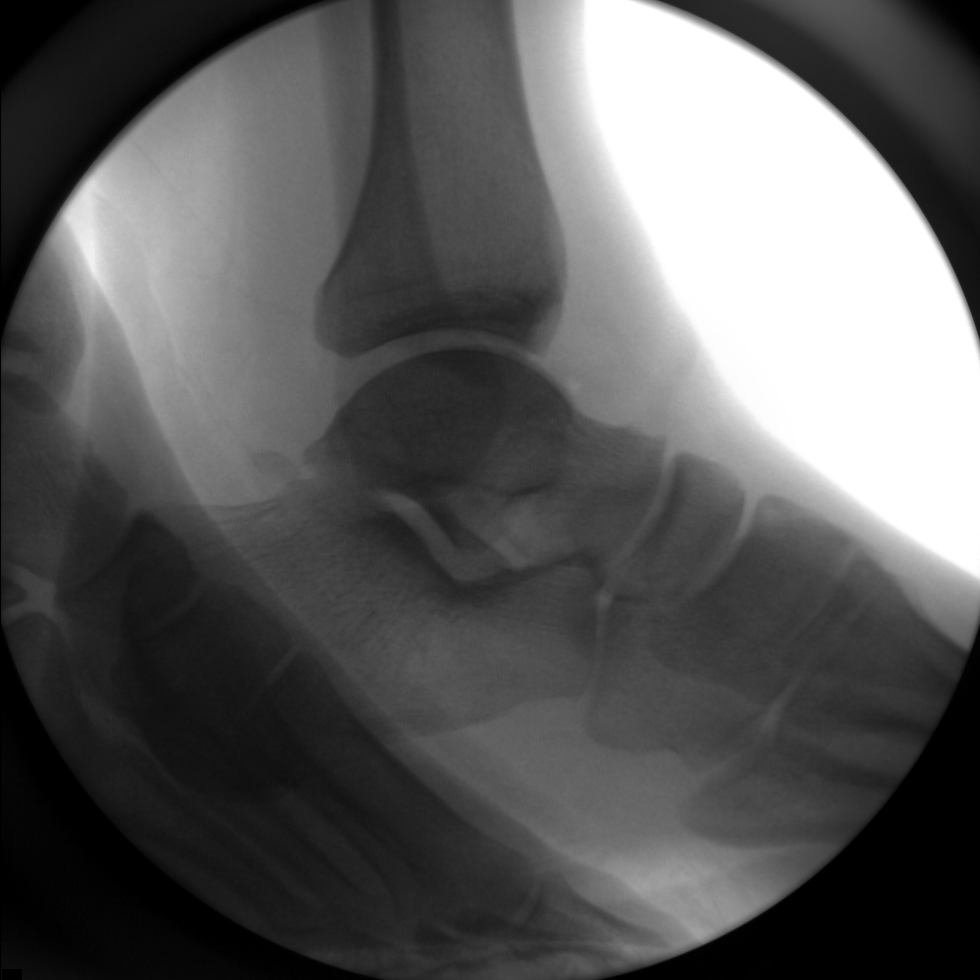

[1 of 1 positions shown; findings below may reference images not displayed]

FINDINGS: Single fluoroscopic lateral view of the ankle is provided. No
orthopedic hardware is present. Poorly visualized is a comminuted
mid talar fracture.
IMPRESSION: Intraoperative localization.
